# Patient Record
Sex: Female | Born: 1965 | Race: Black or African American | Hispanic: No | Marital: Single | Smoking: Former smoker
Health system: Southern US, Community
[De-identification: ages and names within clinical notes are randomized; demographics above are authoritative.]

## PROBLEM LIST (undated history)

## (undated) DIAGNOSIS — E785 Hyperlipidemia, unspecified: Secondary | ICD-10-CM

## (undated) DIAGNOSIS — E669 Obesity, unspecified: Secondary | ICD-10-CM

## (undated) DIAGNOSIS — I10 Essential (primary) hypertension: Secondary | ICD-10-CM

## (undated) HISTORY — DX: Essential (primary) hypertension: I10

## (undated) HISTORY — DX: Hyperlipidemia, unspecified: E78.5

---

## 1997-06-01 ENCOUNTER — Emergency Department (HOSPITAL_COMMUNITY): Admission: EM | Admit: 1997-06-01 | Discharge: 1997-06-01 | Payer: Self-pay | Admitting: Emergency Medicine

## 1997-06-05 ENCOUNTER — Encounter: Admission: RE | Admit: 1997-06-05 | Discharge: 1997-06-05 | Payer: Self-pay | Admitting: Family Medicine

## 1997-06-08 ENCOUNTER — Encounter: Admission: RE | Admit: 1997-06-08 | Discharge: 1997-09-06 | Payer: Self-pay | Admitting: *Deleted

## 1998-10-21 ENCOUNTER — Encounter: Admission: RE | Admit: 1998-10-21 | Discharge: 1998-10-21 | Payer: Self-pay | Admitting: Family Medicine

## 2001-12-11 ENCOUNTER — Inpatient Hospital Stay (HOSPITAL_COMMUNITY): Admission: AD | Admit: 2001-12-11 | Discharge: 2001-12-14 | Payer: Self-pay | Admitting: Obstetrics and Gynecology

## 2001-12-12 ENCOUNTER — Encounter (INDEPENDENT_AMBULATORY_CARE_PROVIDER_SITE_OTHER): Payer: Self-pay | Admitting: *Deleted

## 2001-12-18 ENCOUNTER — Inpatient Hospital Stay (HOSPITAL_COMMUNITY): Admission: AD | Admit: 2001-12-18 | Discharge: 2001-12-18 | Payer: Self-pay | Admitting: Obstetrics and Gynecology

## 2001-12-26 ENCOUNTER — Encounter: Admission: RE | Admit: 2001-12-26 | Discharge: 2001-12-26 | Payer: Self-pay | Admitting: Obstetrics and Gynecology

## 2002-01-07 ENCOUNTER — Encounter: Admission: RE | Admit: 2002-01-07 | Discharge: 2002-01-07 | Payer: Self-pay | Admitting: *Deleted

## 2002-02-04 ENCOUNTER — Other Ambulatory Visit: Admission: RE | Admit: 2002-02-04 | Discharge: 2002-02-04 | Payer: Self-pay | Admitting: Obstetrics and Gynecology

## 2002-02-04 ENCOUNTER — Encounter: Admission: RE | Admit: 2002-02-04 | Discharge: 2002-02-04 | Payer: Self-pay | Admitting: Obstetrics and Gynecology

## 2002-04-08 ENCOUNTER — Encounter: Admission: RE | Admit: 2002-04-08 | Discharge: 2002-04-08 | Payer: Self-pay | Admitting: Obstetrics and Gynecology

## 2003-06-07 ENCOUNTER — Emergency Department (HOSPITAL_COMMUNITY): Admission: EM | Admit: 2003-06-07 | Discharge: 2003-06-07 | Payer: Self-pay | Admitting: Emergency Medicine

## 2004-08-14 ENCOUNTER — Emergency Department (HOSPITAL_COMMUNITY): Admission: EM | Admit: 2004-08-14 | Discharge: 2004-08-15 | Payer: Self-pay | Admitting: Emergency Medicine

## 2004-09-12 ENCOUNTER — Emergency Department (HOSPITAL_COMMUNITY): Admission: EM | Admit: 2004-09-12 | Discharge: 2004-09-12 | Payer: Self-pay | Admitting: Emergency Medicine

## 2005-09-28 ENCOUNTER — Emergency Department (HOSPITAL_COMMUNITY): Admission: EM | Admit: 2005-09-28 | Discharge: 2005-09-28 | Payer: Self-pay | Admitting: Emergency Medicine

## 2008-07-08 ENCOUNTER — Emergency Department (HOSPITAL_COMMUNITY): Admission: EM | Admit: 2008-07-08 | Discharge: 2008-07-08 | Payer: Self-pay | Admitting: Family Medicine

## 2008-09-19 ENCOUNTER — Inpatient Hospital Stay (HOSPITAL_COMMUNITY): Admission: EM | Admit: 2008-09-19 | Discharge: 2008-09-23 | Payer: Self-pay | Admitting: Emergency Medicine

## 2008-09-20 ENCOUNTER — Encounter (INDEPENDENT_AMBULATORY_CARE_PROVIDER_SITE_OTHER): Payer: Self-pay | Admitting: *Deleted

## 2008-09-20 LAB — CONVERTED CEMR LAB: HDL: 27 mg/dL

## 2008-09-23 ENCOUNTER — Encounter (INDEPENDENT_AMBULATORY_CARE_PROVIDER_SITE_OTHER): Payer: Self-pay | Admitting: *Deleted

## 2008-11-02 ENCOUNTER — Ambulatory Visit: Payer: Self-pay | Admitting: Internal Medicine

## 2008-11-02 ENCOUNTER — Encounter (INDEPENDENT_AMBULATORY_CARE_PROVIDER_SITE_OTHER): Payer: Self-pay | Admitting: Internal Medicine

## 2008-11-02 DIAGNOSIS — R030 Elevated blood-pressure reading, without diagnosis of hypertension: Secondary | ICD-10-CM | POA: Insufficient documentation

## 2008-11-02 DIAGNOSIS — I219 Acute myocardial infarction, unspecified: Secondary | ICD-10-CM | POA: Insufficient documentation

## 2008-11-02 DIAGNOSIS — E119 Type 2 diabetes mellitus without complications: Secondary | ICD-10-CM

## 2008-11-02 DIAGNOSIS — D649 Anemia, unspecified: Secondary | ICD-10-CM

## 2008-11-03 ENCOUNTER — Encounter (INDEPENDENT_AMBULATORY_CARE_PROVIDER_SITE_OTHER): Payer: Self-pay | Admitting: Internal Medicine

## 2008-11-06 ENCOUNTER — Ambulatory Visit: Payer: Self-pay | Admitting: Internal Medicine

## 2008-11-06 LAB — CONVERTED CEMR LAB: Blood Glucose, Home Monitor: 2 mg/dL

## 2008-11-09 LAB — CONVERTED CEMR LAB
Iron: 10 ug/dL — ABNORMAL LOW (ref 42–145)
Saturation Ratios: 2 % — ABNORMAL LOW (ref 20–55)
TIBC: 405 ug/dL (ref 250–470)
TSH: 2.276 microintl units/mL (ref 0.350–4.5)
UIBC: 395 ug/dL
Vitamin B-12: 261 pg/mL (ref 211–911)

## 2008-11-10 ENCOUNTER — Encounter (INDEPENDENT_AMBULATORY_CARE_PROVIDER_SITE_OTHER): Payer: Self-pay | Admitting: *Deleted

## 2008-11-10 ENCOUNTER — Telehealth (INDEPENDENT_AMBULATORY_CARE_PROVIDER_SITE_OTHER): Payer: Self-pay | Admitting: Internal Medicine

## 2008-11-24 ENCOUNTER — Ambulatory Visit: Payer: Self-pay | Admitting: Infectious Disease

## 2008-11-27 ENCOUNTER — Ambulatory Visit: Payer: Self-pay | Admitting: Internal Medicine

## 2008-12-08 ENCOUNTER — Emergency Department (HOSPITAL_COMMUNITY): Admission: EM | Admit: 2008-12-08 | Discharge: 2008-12-08 | Payer: Self-pay | Admitting: Emergency Medicine

## 2008-12-21 ENCOUNTER — Telehealth: Payer: Self-pay | Admitting: Internal Medicine

## 2009-02-08 ENCOUNTER — Telehealth: Payer: Self-pay | Admitting: Internal Medicine

## 2009-07-09 ENCOUNTER — Emergency Department (HOSPITAL_COMMUNITY): Admission: EM | Admit: 2009-07-09 | Discharge: 2009-07-09 | Payer: Self-pay | Admitting: Emergency Medicine

## 2009-07-09 ENCOUNTER — Emergency Department (HOSPITAL_COMMUNITY): Admission: EM | Admit: 2009-07-09 | Discharge: 2009-07-09 | Payer: Self-pay | Admitting: Family Medicine

## 2009-08-18 ENCOUNTER — Emergency Department (HOSPITAL_COMMUNITY): Admission: EM | Admit: 2009-08-18 | Discharge: 2009-08-18 | Payer: Self-pay | Admitting: Family Medicine

## 2009-08-22 ENCOUNTER — Emergency Department (HOSPITAL_COMMUNITY): Admission: EM | Admit: 2009-08-22 | Discharge: 2009-08-22 | Payer: Self-pay | Admitting: Family Medicine

## 2010-02-15 NOTE — Progress Notes (Signed)
Summary: phone/gg  Phone Note Call from Patient   Caller: Patient Summary of Call: Pt call with c/o rt  ear pain for 1 week.  Today increase pain with radiation to jaw and neck.  Denies  any dranage.  It feels like she needs to pop ear. We are unable to see today.  Pt advised to be seen at [facility] today.  Pt voices understanding Initial call taken by: Merrie Roof RN,  February 08, 2009 10:49 AM  Follow-up for Phone Call        Agree with plan. Follow-up by: Blanch Media MD,  February 08, 2009 11:17 AM

## 2010-03-16 ENCOUNTER — Inpatient Hospital Stay (INDEPENDENT_AMBULATORY_CARE_PROVIDER_SITE_OTHER)
Admission: RE | Admit: 2010-03-16 | Discharge: 2010-03-16 | Disposition: A | Discharge status: Home / Self Care | Payer: BC Managed Care – PPO | Source: Ambulatory Visit | Attending: Emergency Medicine | Admitting: Emergency Medicine

## 2010-03-16 DIAGNOSIS — J019 Acute sinusitis, unspecified: Secondary | ICD-10-CM

## 2010-03-16 DIAGNOSIS — J40 Bronchitis, not specified as acute or chronic: Secondary | ICD-10-CM

## 2010-03-16 DIAGNOSIS — R071 Chest pain on breathing: Secondary | ICD-10-CM

## 2010-04-22 LAB — CARDIAC PANEL(CRET KIN+CKTOT+MB+TROPI)
CK, MB: 24.3 ng/mL — ABNORMAL HIGH (ref 0.3–4.0)
CK, MB: 44.6 ng/mL — ABNORMAL HIGH (ref 0.3–4.0)
Total CK: 508 U/L — ABNORMAL HIGH (ref 7–177)
Troponin I: 6.82 ng/mL (ref 0.00–0.06)

## 2010-04-22 LAB — CK TOTAL AND CKMB (NOT AT ARMC)
CK, MB: 7.2 ng/mL — ABNORMAL HIGH (ref 0.3–4.0)
Relative Index: 2.6 — ABNORMAL HIGH (ref 0.0–2.5)

## 2010-04-22 LAB — COMPREHENSIVE METABOLIC PANEL
ALT: 20 U/L (ref 0–35)
AST: 30 U/L (ref 0–37)
CO2: 24 mEq/L (ref 19–32)
Calcium: 8.1 mg/dL — ABNORMAL LOW (ref 8.4–10.5)
Creatinine, Ser: 0.75 mg/dL (ref 0.4–1.2)
GFR calc non Af Amer: >60 mL/min (ref >60)
Potassium: 3.5 mEq/L (ref 3.5–5.1)
Total Bilirubin: 0.4 mg/dL (ref 0.3–1.2)
Total Protein: 6.8 g/dL (ref 6.0–8.3)

## 2010-04-22 LAB — BASIC METABOLIC PANEL
CO2: 29 mEq/L (ref 19–32)
Calcium: 8.4 mg/dL (ref 8.4–10.5)
Calcium: 8.4 mg/dL (ref 8.4–10.5)
Chloride: 107 mEq/L (ref 96–112)
Creatinine, Ser: 0.77 mg/dL (ref 0.4–1.2)
Creatinine, Ser: 0.8 mg/dL (ref 0.4–1.2)
GFR calc non Af Amer: >60 mL/min (ref >60)
Glucose, Bld: 132 mg/dL — ABNORMAL HIGH (ref 70–99)

## 2010-04-22 LAB — GLUCOSE, CAPILLARY
Glucose-Capillary: 103 mg/dL — ABNORMAL HIGH (ref 70–99)
Glucose-Capillary: 91 mg/dL (ref 70–99)

## 2010-04-22 LAB — CBC
HCT: 36.7 % (ref 36.0–46.0)
Hemoglobin: 11.9 g/dL — ABNORMAL LOW (ref 12.0–15.0)
MCHC: 32.3 g/dL (ref 30.0–36.0)
MCV: 72.6 fL — ABNORMAL LOW (ref 78.0–100.0)
Platelets: 263 10*3/uL (ref 150–400)
RBC: 4.66 MIL/uL (ref 3.87–5.11)
RBC: 5.06 MIL/uL (ref 3.87–5.11)
RDW: 18.1 % — ABNORMAL HIGH (ref 11.5–15.5)
RDW: 18.4 % — ABNORMAL HIGH (ref 11.5–15.5)
WBC: 5.1 10*3/uL (ref 4.0–10.5)
WBC: 6.3 10*3/uL (ref 4.0–10.5)
WBC: 8 10*3/uL (ref 4.0–10.5)

## 2010-04-22 LAB — LIPID PANEL
Cholesterol: 108 mg/dL (ref 0–200)
HDL: 27 mg/dL — ABNORMAL LOW (ref >39)
LDL Cholesterol: 69 mg/dL (ref 0–99)
Total CHOL/HDL Ratio: 4 RATIO

## 2010-04-22 LAB — POCT CARDIAC MARKERS

## 2010-04-22 LAB — SAMPLE TO BLOOD BANK

## 2010-04-22 LAB — DIFFERENTIAL
Basophils Relative: 0 % (ref 0–1)
Eosinophils Absolute: 0.2 10*3/uL (ref 0.0–0.7)
Monocytes Relative: 7 % (ref 3–12)
Neutrophils Relative %: 44 % (ref 43–77)

## 2010-04-22 LAB — CK: Total CK: 381 U/L — ABNORMAL HIGH (ref 7–177)

## 2010-04-22 LAB — HEMOGLOBIN A1C: Mean Plasma Glucose: 146 mg/dL

## 2010-06-03 NOTE — Discharge Summary (Signed)
   NAME:  Rachel Avila, Rachel Avila                           ACCOUNT NO.:  [id number]   MEDICAL RECORD NO.:  [id number]                   PATIENT TYPE:  INP   LOCATION:  9114                                 FACILITY:  [facility]   PHYSICIAN:  Phil D. Okey Dupre, M.D.                  DATE OF BIRTH:  06/12/1965   DATE OF ADMISSION:  12/11/2001  DATE OF DISCHARGE:  12/14/2001                                 DISCHARGE SUMMARY   REASON FOR HOSPITALIZATION:  A 45 year old gravida 2 para 2-0-0-2 who  presented to [facility] emergency room with abdominal pain the a.m. of  admission, having had normal periods up until this month.  Was found to have  been pregnant at term and the cervix was 75/5 at a -3 station and the  patient was enormously obese.   HOSPITAL COURSE:  She was sent over to [facility] where an epidural  was started and a nonreassuring pattern of the fetal heart tone started  immediately.  As soon as possible she was taken to the operating room.  A  low transverse cesarean section was done for fetal distress.  The baby was a  female weighing 6 pounds 3 ounces with a 1 and 7 Apgar and a pathology  report that showed chorioamnionitis.  The patient did very well  postoperatively, had an afebrile postoperative course, and was discharged to  home on December 14, 2001, to return in four days for staple removal and  drain removal - the patient had a Jackson-Pratt drain.   SIGNIFICANT FINDINGS DURING THIS HOSPITALIZATION:  1. The patient not knowing she was pregnant, being admitted, undergoing     cesarean section for fetal distress.  2. Amnionitis and fundusitis.   DISPOSITION:  1. The patient was discharged in good health, satisfactory.  2. She was given instructions as to follow-up, diet, and activity,     especially heavy lifting and stairs to be avoided in the first two weeks     at home.  3. She was discharged on Motrin and Percocet for pain.   DISCHARGE DIAGNOSIS:  Status post cesarean  section for fetal distress.                                                Phil D. Okey Dupre, M.D.    PDR/MEDQ  D:  01/29/2002  T:  01/29/2002  Job:  [id number]

## 2010-06-03 NOTE — Op Note (Signed)
NAME:  Rachel Avila, BETTON                           ACCOUNT NO.:  [id number]   MEDICAL RECORD NO.:  [id number]                   PATIENT TYPE:  INP   LOCATION:  9171                                 FACILITY:  [facility]   PHYSICIAN:  Phil D. Okey Dupre, M.D.                  DATE OF BIRTH:  Sep 23, 1965   DATE OF PROCEDURE:  12/11/2001  DATE OF DISCHARGE:                                 OPERATIVE REPORT   HISTORY OF PRESENT ILLNESS:  The patient is a 45 year old gravida 2, para 1-  0-0-1 who presented at [facility] Emergency Room with abdominal pain this  a.m. having stated she had regular periods and a diagnosis she is pregnant,  5 cm dilated and had no idea of dates.  Was transferred over for direct  admission to [place].  On admitting the cervix was 70/5 cm at about a -3 station,  difficult because of the enormous obesity of the patient.  To monitor the  patient externally an internal lead we had put on and this ruptured  membranes which presented with heavy meconium.  The patient's laboratories  were drawn, IV started as quickly as possible.  The epidural was started.  The patient gave a significantly nonreassuring pattern with heart rate in  the 80s and low 100-105 and with late decelerations.  As soon as possible  was taken to the operating room where a Foley catheter was placed in the  urinary bladder and the abdomen was prepped and draped in the usual sterile  manner.  Her previous delivery had been by cesarean section.  The procedure  went as follows.   PROCEDURE:  Low transverse cesarean section.   PREOPERATIVE DIAGNOSES:  Fetal distress.   POSTOPERATIVE DIAGNOSES:  Fetal distress.   PROCEDURE AS FOLLOWS:  Under satisfactory epidural anesthesia with the  patient in a dorsal supine position the abdomen was prepped and draped in  the usual sterile manner with a Foley catheter in the urinary bladder  entered through a Pfannenstiel incision situated 4 cm above the symphysis  pubis and entered by  layers.  On entering the peritoneal cavity the  visceroperitoneum on the anterior surface of the uterus was opened  transversely by sharp dissection, the bladder pushed away from the leaf of  the lower uterine segment.  Was entered by sharp and blunt dissection.  Immediately entering the peritoneal cavity copious amounts of pea soup  meconium extruded from the incision.  The baby was in a direct posterior and  was delivered quite easily.  Before the baby was able to take a breath, the  upper respiratory tract was sucked out with the DeLee suction.  Cord doubly  clamped.  Meanwhile, the baby immediately taken to the pediatricians.  It  was a girl weighing 6 pounds 3 ounces with a 1/7 Apgar.  The placenta was  spontaneously moved and the uterus explored and  placenta sent for  pathological diagnosis.  The uterus was then closed with a continuous  running locked 0 Vicryl on an atraumatic needle.  Area was observed for  bleeding.  None was noted.  Peritoneal cavity was irrigated with normal  saline and the fascia closed from either incision meeting in the mid point.  A Blake suction drain was placed in the wound and exited through a separate  stab wound just above the right end of the incision and the skin edge  approximated with skin staples.  Dry, sterile dressing was applied.  The  patient had 600 cc blood loss.  Transferred to recovery room in satisfactory  condition.                                               Phil D. Okey Dupre, M.D.    PDR/MEDQ  D:  12/11/2001  T:  12/11/2001  Job:  [id number]

## 2010-06-06 ENCOUNTER — Encounter: Payer: Self-pay | Admitting: Internal Medicine

## 2010-06-06 DIAGNOSIS — E119 Type 2 diabetes mellitus without complications: Secondary | ICD-10-CM | POA: Insufficient documentation

## 2010-06-14 ENCOUNTER — Encounter: Payer: Self-pay | Admitting: Internal Medicine

## 2011-03-07 ENCOUNTER — Encounter (HOSPITAL_COMMUNITY): Payer: Self-pay | Admitting: Emergency Medicine

## 2011-03-07 ENCOUNTER — Emergency Department (HOSPITAL_COMMUNITY)
Admission: EM | Admit: 2011-03-07 | Discharge: 2011-03-07 | Disposition: A | Discharge status: Home / Self Care | Payer: BC Managed Care – PPO | Attending: Emergency Medicine | Admitting: Emergency Medicine

## 2011-03-07 DIAGNOSIS — H5789 Other specified disorders of eye and adnexa: Secondary | ICD-10-CM | POA: Insufficient documentation

## 2011-03-07 DIAGNOSIS — T1590XA Foreign body on external eye, part unspecified, unspecified eye, initial encounter: Secondary | ICD-10-CM | POA: Insufficient documentation

## 2011-03-07 DIAGNOSIS — S058X9A Other injuries of unspecified eye and orbit, initial encounter: Secondary | ICD-10-CM | POA: Insufficient documentation

## 2011-03-07 DIAGNOSIS — E785 Hyperlipidemia, unspecified: Secondary | ICD-10-CM | POA: Insufficient documentation

## 2011-03-07 DIAGNOSIS — S0502XA Injury of conjunctiva and corneal abrasion without foreign body, left eye, initial encounter: Secondary | ICD-10-CM

## 2011-03-07 DIAGNOSIS — I10 Essential (primary) hypertension: Secondary | ICD-10-CM | POA: Insufficient documentation

## 2011-03-07 DIAGNOSIS — E119 Type 2 diabetes mellitus without complications: Secondary | ICD-10-CM | POA: Insufficient documentation

## 2011-03-07 DIAGNOSIS — F17200 Nicotine dependence, unspecified, uncomplicated: Secondary | ICD-10-CM | POA: Insufficient documentation

## 2011-03-07 DIAGNOSIS — H5710 Ocular pain, unspecified eye: Secondary | ICD-10-CM | POA: Insufficient documentation

## 2011-03-07 MED ORDER — HYDROCODONE-ACETAMINOPHEN 5-500 MG PO TABS: 1.0000 | ORAL_TABLET | Freq: Four times a day (QID) | ORAL | Status: AC | PRN

## 2011-03-07 MED ORDER — ERYTHROMYCIN 5 MG/GM OP OINT: TOPICAL_OINTMENT | Freq: Four times a day (QID) | OPHTHALMIC | Status: AC

## 2011-03-07 MED ORDER — ERYTHROMYCIN 5 MG/GM OP OINT
TOPICAL_OINTMENT | Freq: Once | OPHTHALMIC | Status: AC
  Administered 2011-03-07: 19:00:00 via OPHTHALMIC
  Filled 2011-03-07: qty 1

## 2011-03-07 MED ORDER — TETRACAINE HCL 0.5 % OP SOLN
2.0000 [drp] | Freq: Once | OPHTHALMIC | Status: AC
  Administered 2011-03-07: 2 [drp] via OPHTHALMIC
  Filled 2011-03-07: qty 2

## 2011-03-07 MED ORDER — TETANUS-DIPHTH-ACELL PERTUSSIS 5-2.5-18.5 LF-MCG/0.5 IM SUSP
0.5000 mL | Freq: Once | INTRAMUSCULAR | Status: AC
  Administered 2011-03-07: 0.5 mL via INTRAMUSCULAR
  Filled 2011-03-07: qty 0.5

## 2011-03-07 MED ORDER — FLUORESCEIN SODIUM 1 MG OP STRP
1.0000 | ORAL_STRIP | Freq: Once | OPHTHALMIC | Status: AC
  Administered 2011-03-07: 1 via OPHTHALMIC
  Filled 2011-03-07: qty 1

## 2011-03-07 NOTE — Discharge Instructions (Signed)
Corneal Abrasion The cornea is the clear covering at the front and center of the eye. It is a thin tissue made up of layers. The top layer is the most sensitive layer. A corneal abrasion happens if this layer is scratched or an injury causes it to come off.  HOME CARE  You may be given drops or a medicated cream. Use the medicine as told by your doctor.   A pressure patch may be put over the eye. If this is done, follow your doctor's instructions for when to remove the patch. Do not drive or use machines while the eye patch is on. Judging distances is hard to do with a patch on.   See your doctor for a follow-up exam if you are told to do so.  GET HELP RIGHT AWAY IF:   The pain is getting worse or is very bad.   The eye is very sensitive to light.   Any liquid comes out of the injured eye after treatment.   Your vision suddenly gets worse.   You have a sudden loss of vision or blindness.  MAKE SURE YOU:   Understand these instructions.   Will watch your condition.   Will get help right away if you are not doing well or get worse.  Document Released: 06/21/2007 Document Revised: 09/14/2010 Document Reviewed: 06/21/2007 [employer] Patient Information 2012 [employer]. 

## 2011-03-07 NOTE — ED Provider Notes (Signed)
History     CSN: [id number]  Arrival date & time 03/07/11  1500   None     Chief Complaint  Patient presents with  . Eye Problem    (Consider location/radiation/quality/duration/timing/severity/associated sxs/prior treatment) HPI History provided by the patient.  46 year old female presenting with complaint of left eye injury and pain.  Pt  states that yesterday morning an unknown object or insect flew in her eye.  She reports immediate left eye pain and redness as well as mild blurry vision.  She immediately irrigated her eye with the "eye machine" twice which resulted in improvement. Patient left work and obtain artificial teardrops which applied after return of her discomfort. This also helped some. Patient states that she awoke this morning with worsening pain and what she believes is mild local swelling around her eye as well as decreased vision.  Patient reports mild watery discharge from the left eye without purulence. Patient's last tetanus is unknown. Patient has not been seen recently for these complaints. Patient does not wear contacts or use glasses. Patient reports symptoms as constant, moderate to severe, and worse when the left eye is open.    Past Medical History  Diagnosis Date  . Diabetes mellitus   . Hypertension   . Hyperlipidemia     Past Surgical History  Procedure Date  . Cesarean section     History reviewed. No pertinent family history.  History  Substance Use Topics  . Smoking status: Current Everyday Smoker -- 0.5 packs/day  . Smokeless tobacco: Not on file  . Alcohol Use: No    OB History    Grav Para Term Preterm Abortions TAB SAB Ect Mult Living                  Review of Systems  Constitutional: Negative for fever and chills.  HENT: Negative for congestion, sore throat and rhinorrhea.   Eyes: Positive for pain, discharge, redness and visual disturbance.  Respiratory: Negative for cough, shortness of breath and wheezing.     Cardiovascular: Negative for chest pain and palpitations.  Gastrointestinal: Negative for nausea, vomiting, abdominal pain, diarrhea and blood in stool.  Genitourinary: Negative for dysuria and hematuria.  Musculoskeletal: Negative for back pain and gait problem.  Skin: Negative for rash and wound.  Neurological: Negative for dizziness and headaches.  Psychiatric/Behavioral: Negative for confusion and agitation.  All other systems reviewed and are negative.    Allergies  Bee venom  Home Medications   Current Outpatient Rx  Name Route Sig Dispense Refill  . AMLODIPINE BESYLATE 2.5 MG PO TABS Oral Take 2.5 mg by mouth daily.      . ASPIRIN 81 MG PO TABS Oral Take 81 mg by mouth daily. Take 1 pill by mouth daily 30 minutes before you take niaspan     . FERROUS SULFATE 325 (65 FE) MG PO TBEC Oral Take 325 mg by mouth 2 (two) times daily with a meal.      . LISINOPRIL 10 MG PO TABS Oral Take 10 mg by mouth daily.      Marland Kitchen METFORMIN HCL 500 MG PO TABS Oral Take 500 mg by mouth every morning.     Marland Kitchen METOPROLOL SUCCINATE ER 50 MG PO TB24 Oral Take 50 mg by mouth daily.      Marland Kitchen NIACIN ER (ANTIHYPERLIPIDEMIC) 500 MG PO TBCR Oral Take 500 mg by mouth at bedtime.      Marland Kitchen NITROGLYCERIN 0.4 MG SL SUBL Sublingual Place 0.4 mg under  the tongue every 5 (five) minutes as needed. For chest pain    . SIMVASTATIN 20 MG PO TABS Oral Take 20 mg by mouth at bedtime.      Marland Kitchen GLUCOSE BLOOD VI STRP Other 1 each by Other route 2 (two) times daily. Use as instructed (Bayer Contour test strip), use to check blood sugar 2x daily     . LANCETS MISC Does not apply by Does not apply route. Use to test blood sugar 2x daily       BP 163/118  Pulse 62  Temp 98.5 F (36.9 C)  Resp 18  SpO2 97%  Physical Exam  Nursing note and vitals reviewed. Constitutional: She is oriented to person, place, and time. No distress.       Morbidly obese, appears uncomfortable but NAD, mother at bedside  HENT:  Head: Normocephalic and  atraumatic.  Right Ear: External ear normal.  Left Ear: External ear normal.  Nose: Nose normal.  Mouth/Throat: Oropharynx is clear and moist.  Eyes: Conjunctivae and EOM are normal. Pupils are equal, round, and reactive to light.       No periorbital edema, no pain elicited with Lt eye movement; no proptosis.  VA:  Rt: 20/20; Lt 20/20 -2.  After fluorescein staining:  Small speck (<69mm) of uptake at ~5 o'clock on the cornea  Neck: Normal range of motion. Neck supple.  Cardiovascular: Normal rate and regular rhythm.   Pulmonary/Chest: Effort normal and breath sounds normal. No respiratory distress.  Abdominal: Soft. Bowel sounds are normal. There is no tenderness.  Musculoskeletal: Normal range of motion. She exhibits no edema.  Neurological: She is alert and oriented to person, place, and time.  Skin: Skin is warm and dry. She is not diaphoretic.  Psychiatric: She has a normal mood and affect. Judgment normal.    ED Course  Procedures (including critical care time)  Labs Reviewed - No data to display No results found.   1. Corneal abrasion, left       MDM  46 year old female presenting one day status post possible injury to the left eye with an insect. Patient with intermittent pain improving with irrigation. The patient does not wear contacts.  Exam as above, minimally decreased vision on the left; fluorescein staining with small speck/corneal abrasion; improved pain after tetracaine drops also consistent with corneal abrasion. No signs of orbital or periorbital cellulitis or acute glaucoma.  Will treat with erythromycin ointment and have patient followup with ophthalmology for persistent symptoms.      Particia Lather, MD 03/08/11 [phone]

## 2011-03-07 NOTE — ED Notes (Signed)
Pt  States that she got something in her left eye at work yesterday  And it has got worse today

## 2011-03-08 NOTE — ED Provider Notes (Signed)
  I performed a history and physical examination of Rachel Avila and discussed her management with Dr. Lendell Caprice.  I agree with the history, physical, assessment, and plan of care, with the following exceptions: None  I was present for the following procedures: None Time Spent in Critical Care of the patient: None Time spent in discussions with the patient and family: 31  Rachel Avila  Well-appearing young female presents with left eye pain, on exam is found to have mild corneal abrasion.  Patient is not a contact lens wearer.  She was discharged in stable condition.  Gerhard Munch, MD 03/08/11 2201

## 2011-10-05 ENCOUNTER — Emergency Department (HOSPITAL_COMMUNITY): Payer: BC Managed Care – PPO

## 2011-10-05 ENCOUNTER — Encounter (HOSPITAL_COMMUNITY): Payer: Self-pay | Admitting: *Deleted

## 2011-10-05 ENCOUNTER — Emergency Department (HOSPITAL_COMMUNITY)
Admission: EM | Admit: 2011-10-05 | Discharge: 2011-10-06 | Disposition: A | Discharge status: Home / Self Care | Payer: BC Managed Care – PPO | Attending: Emergency Medicine | Admitting: Emergency Medicine

## 2011-10-05 DIAGNOSIS — I10 Essential (primary) hypertension: Secondary | ICD-10-CM | POA: Insufficient documentation

## 2011-10-05 DIAGNOSIS — J069 Acute upper respiratory infection, unspecified: Secondary | ICD-10-CM | POA: Insufficient documentation

## 2011-10-05 DIAGNOSIS — E785 Hyperlipidemia, unspecified: Secondary | ICD-10-CM | POA: Insufficient documentation

## 2011-10-05 DIAGNOSIS — E119 Type 2 diabetes mellitus without complications: Secondary | ICD-10-CM | POA: Insufficient documentation

## 2011-10-05 DIAGNOSIS — B9789 Other viral agents as the cause of diseases classified elsewhere: Secondary | ICD-10-CM | POA: Insufficient documentation

## 2011-10-05 DIAGNOSIS — Z87891 Personal history of nicotine dependence: Secondary | ICD-10-CM | POA: Insufficient documentation

## 2011-10-05 DIAGNOSIS — Z91038 Other insect allergy status: Secondary | ICD-10-CM | POA: Insufficient documentation

## 2011-10-05 MED ORDER — IPRATROPIUM BROMIDE 0.02 % IN SOLN
0.5000 mg | Freq: Once | RESPIRATORY_TRACT | Status: AC
  Administered 2011-10-05: 0.5 mg via RESPIRATORY_TRACT
  Filled 2011-10-05: qty 2.5

## 2011-10-05 MED ORDER — ALBUTEROL SULFATE HFA 108 (90 BASE) MCG/ACT IN AERS
2.0000 | INHALATION_SPRAY | RESPIRATORY_TRACT | Status: DC | PRN
  Administered 2011-10-05: 2 via RESPIRATORY_TRACT
  Filled 2011-10-05: qty 6.7

## 2011-10-05 MED ORDER — ALBUTEROL SULFATE (5 MG/ML) 0.5% IN NEBU
5.0000 mg | INHALATION_SOLUTION | Freq: Once | RESPIRATORY_TRACT | Status: AC
  Administered 2011-10-05: 5 mg via RESPIRATORY_TRACT
  Filled 2011-10-05: qty 1

## 2011-10-05 MED ORDER — HYDROCOD POLST-CHLORPHEN POLST 10-8 MG/5ML PO LQCR: 5.0000 mL | Freq: Two times a day (BID) | ORAL | Status: DC | PRN

## 2011-10-05 NOTE — ED Notes (Addendum)
Patient with flu like symptoms since Tuesday, chills low grade fever, body aches, and sore throat.  Patient is also experiencing nasal congestion/drainage.  Patient with wheezing and coughing

## 2011-10-05 NOTE — ED Provider Notes (Signed)
History     CSN: [id number]  Arrival date & time 10/05/11  2002   First MD Initiated Contact with Patient 10/05/11 2055      Chief Complaint  Patient presents with  . Influenza    (Consider location/radiation/quality/duration/timing/severity/associated sxs/prior treatment) HPI History provided by pt.   Pt believes she has the flu.  Developed a sore throat 2 days ago, followed closely by a non-productive cough,  diffuse chest pain w/ cough and chest congestion, diffuse head pressure, rhinorrhea and N/V.  Has also had generalized weakness and body aches.  Denies fever, abdominal pain, diarrhea and urinary sx.  Has not taken anything for sx.  Known sick contacts.    Past Medical History  Diagnosis Date  . Diabetes mellitus   . Hypertension   . Hyperlipidemia     Past Surgical History  Procedure Date  . Cesarean section     No family history on file.  History  Substance Use Topics  . Smoking status: Former Smoker -- 0.5 packs/day  . Smokeless tobacco: Not on file  . Alcohol Use: No    OB History    Grav Para Term Preterm Abortions TAB SAB Ect Mult Living                  Review of Systems  All other systems reviewed and are negative.    Allergies  Bee venom  Home Medications   Current Outpatient Rx  Name Route Sig Dispense Refill  . AMLODIPINE BESYLATE 2.5 MG PO TABS Oral Take 2.5 mg by mouth daily.      . ASPIRIN 81 MG PO TABS Oral Take 81 mg by mouth daily. Take 1 pill by mouth daily 30 minutes before you take niaspan     . FERROUS SULFATE 325 (65 FE) MG PO TBEC Oral Take 325 mg by mouth 2 (two) times daily with a meal.      . LISINOPRIL 10 MG PO TABS Oral Take 10 mg by mouth daily.      Marland Kitchen METFORMIN HCL 500 MG PO TABS Oral Take 500 mg by mouth every morning.     Marland Kitchen METOPROLOL SUCCINATE ER 50 MG PO TB24 Oral Take 50 mg by mouth daily.      Marland Kitchen NIACIN ER (ANTIHYPERLIPIDEMIC) 500 MG PO TBCR Oral Take 500 mg by mouth at bedtime.      Marland Kitchen NITROGLYCERIN 0.4 MG SL  SUBL Sublingual Place 0.4 mg under the tongue every 5 (five) minutes as needed. For chest pain    . SIMVASTATIN 20 MG PO TABS Oral Take 20 mg by mouth at bedtime.        BP 166/108  Pulse 92  Temp 98.3 F (36.8 C) (Oral)  Resp 22  SpO2 95%  LMP 10/05/2011  Physical Exam  Nursing note and vitals reviewed. Constitutional: She is oriented to person, place, and time. She appears well-developed and well-nourished. No distress.       Morbidly obese  HENT:  Head: Normocephalic and atraumatic. No trismus in the jaw.  Mouth/Throat: Uvula is midline, oropharynx is clear and moist and mucous membranes are normal. No posterior oropharyngeal edema or posterior oropharyngeal erythema.       Nasal discharge  Eyes:       Normal appearance  Neck: Normal range of motion. Neck supple.  Cardiovascular: Normal rate and regular rhythm.   Pulmonary/Chest: Effort normal and breath sounds normal.       Diffuse, mild tenderness  Abdominal: Soft. Bowel  sounds are normal. She exhibits no distension. There is no tenderness.  Musculoskeletal: Normal range of motion.  Lymphadenopathy:    She has no cervical adenopathy.  Neurological: She is alert and oriented to person, place, and time.  Skin: Skin is warm and dry. No rash noted.  Psychiatric: She has a normal mood and affect. Her behavior is normal.    ED Course  Procedures (including critical care time)  Labs Reviewed - No data to display Dg Chest 2 View  10/05/2011  *RADIOLOGY REPORT*  Clinical Data: Chest congestion with cough.  Wheezing and shortness of breath.  CHEST - 2 VIEW  Comparison: One view chest of 09/19/2008.  Findings: The heart size is normal.  The lungs are clear. Degenerative changes are present in the thoracic spine.  The lateral view is obscured by arm position.  IMPRESSION: No acute cardiopulmonary disease.   Original Report Authenticated By: Jamesetta Orleans. MATTERN, M.D.      1. Viral URI with cough       MDM  46yo F w/ h/o  HTN and diabetes presents w/ URI sx x 2d.  On exam, afebrile, no respiratory distress, diffuse inspiratory and expiratory wheezing, nml HEENT.  CXR obtained in triage and neg for pneumonia. Pt received two breathing treatments w/ relief.  Breath sounds coarsened on re-examination and patient ambulates w/out becoming short of breath.  D/c'd home w/ albuterol inhaler, prescription for tussionex and recommendation to take tylenol or low dose ibuprofen as well as short course of sudafed for symptoms.  Return precautions including worsening SOB as well as CP discussed.         Arie Sabina [place], [place] 10/05/11 2337

## 2011-10-05 NOTE — ED Notes (Signed)
Patient has completed 2nd neb tx. States that she feels a little better but breathing is still not great.

## 2011-10-05 NOTE — ED Notes (Signed)
Patient was given discharge instructions and released from the ED in stable condition.  

## 2011-10-06 NOTE — ED Provider Notes (Signed)
Medical screening examination/treatment/procedure(s) were performed by non-physician practitioner and as supervising physician I was immediately available for consultation/collaboration.  Cheri Guppy, MD 10/06/11 [phone]

## 2012-07-25 ENCOUNTER — Other Ambulatory Visit: Payer: Self-pay

## 2013-01-03 ENCOUNTER — Emergency Department (HOSPITAL_COMMUNITY)
Admission: EM | Admit: 2013-01-03 | Discharge: 2013-01-04 | Disposition: A | Discharge status: Home / Self Care | Payer: BC Managed Care – PPO | Attending: Emergency Medicine | Admitting: Emergency Medicine

## 2013-01-03 ENCOUNTER — Emergency Department (HOSPITAL_COMMUNITY): Payer: BC Managed Care – PPO

## 2013-01-03 ENCOUNTER — Encounter (HOSPITAL_COMMUNITY): Payer: Self-pay | Admitting: Emergency Medicine

## 2013-01-03 DIAGNOSIS — R6883 Chills (without fever): Secondary | ICD-10-CM | POA: Insufficient documentation

## 2013-01-03 DIAGNOSIS — J189 Pneumonia, unspecified organism: Secondary | ICD-10-CM

## 2013-01-03 DIAGNOSIS — E785 Hyperlipidemia, unspecified: Secondary | ICD-10-CM | POA: Insufficient documentation

## 2013-01-03 DIAGNOSIS — I10 Essential (primary) hypertension: Secondary | ICD-10-CM | POA: Insufficient documentation

## 2013-01-03 DIAGNOSIS — Z7982 Long term (current) use of aspirin: Secondary | ICD-10-CM | POA: Insufficient documentation

## 2013-01-03 DIAGNOSIS — R197 Diarrhea, unspecified: Secondary | ICD-10-CM | POA: Insufficient documentation

## 2013-01-03 DIAGNOSIS — J3489 Other specified disorders of nose and nasal sinuses: Secondary | ICD-10-CM | POA: Insufficient documentation

## 2013-01-03 DIAGNOSIS — Z87891 Personal history of nicotine dependence: Secondary | ICD-10-CM | POA: Insufficient documentation

## 2013-01-03 DIAGNOSIS — R112 Nausea with vomiting, unspecified: Secondary | ICD-10-CM | POA: Insufficient documentation

## 2013-01-03 DIAGNOSIS — R5381 Other malaise: Secondary | ICD-10-CM | POA: Insufficient documentation

## 2013-01-03 DIAGNOSIS — Z79899 Other long term (current) drug therapy: Secondary | ICD-10-CM | POA: Insufficient documentation

## 2013-01-03 DIAGNOSIS — E119 Type 2 diabetes mellitus without complications: Secondary | ICD-10-CM | POA: Insufficient documentation

## 2013-01-03 DIAGNOSIS — IMO0001 Reserved for inherently not codable concepts without codable children: Secondary | ICD-10-CM | POA: Insufficient documentation

## 2013-01-03 LAB — CBC WITH DIFFERENTIAL/PLATELET
Basophils Absolute: 0 10*3/uL (ref 0.0–0.1)
Basophils Relative: 0 % (ref 0–1)
Eosinophils Absolute: 0.1 10*3/uL (ref 0.0–0.7)
Eosinophils Relative: 2 % (ref 0–5)
HCT: 36.2 % (ref 36.0–46.0)
MCH: 23.3 pg — ABNORMAL LOW (ref 26.0–34.0)
MCHC: 33.1 g/dL (ref 30.0–36.0)
Monocytes Absolute: 0.6 10*3/uL (ref 0.1–1.0)
Neutro Abs: 1.9 10*3/uL (ref 1.7–7.7)
RDW: 17.7 % — ABNORMAL HIGH (ref 11.5–15.5)

## 2013-01-03 LAB — URINALYSIS, ROUTINE W REFLEX MICROSCOPIC
Ketones, ur: 15 mg/dL — AB
Nitrite: NEGATIVE
Protein, ur: 30 mg/dL — AB
Urobilinogen, UA: 1 mg/dL (ref 0.0–1.0)

## 2013-01-03 LAB — COMPREHENSIVE METABOLIC PANEL
AST: 35 U/L (ref 0–37)
Albumin: 3.6 g/dL (ref 3.5–5.2)
BUN: 8 mg/dL (ref 6–23)
Calcium: 8.5 mg/dL (ref 8.4–10.5)
Chloride: 104 mEq/L (ref 96–112)
Creatinine, Ser: 0.87 mg/dL (ref 0.50–1.10)
Total Protein: 7.3 g/dL (ref 6.0–8.3)

## 2013-01-03 LAB — URINE MICROSCOPIC-ADD ON

## 2013-01-03 MED ORDER — SODIUM CHLORIDE 0.9 % IV BOLUS (SEPSIS)
500.0000 mL | Freq: Once | INTRAVENOUS | Status: AC
  Administered 2013-01-03: 500 mL via INTRAVENOUS

## 2013-01-03 MED ORDER — SODIUM CHLORIDE 0.9 % IV SOLN: INTRAVENOUS | Status: DC

## 2013-01-03 MED ORDER — DEXTROSE 5 % IV SOLN
1.0000 g | Freq: Once | INTRAVENOUS | Status: AC
  Administered 2013-01-03: 1 g via INTRAVENOUS
  Filled 2013-01-03: qty 10

## 2013-01-03 MED ORDER — AZITHROMYCIN 250 MG PO TABS: 250.0000 mg | ORAL_TABLET | Freq: Every day | ORAL | Status: DC

## 2013-01-03 NOTE — ED Notes (Signed)
Patient presents with c/o chills, cough, coughs so hard she sometimes will have a small amount of "emesis".  States everything is clear.  Daughter was seen and given a prescription for Tamiflu

## 2013-01-03 NOTE — ED Notes (Signed)
Dr Zackowski in to see patient. 

## 2013-01-03 NOTE — ED Notes (Signed)
Pt reports that earlier in the week she started to have a cough, fever and vomiting with diarrhea. States that she has not gotten any better over the past couple of days. Reports that she has not been able to go to work for the past 2 days.

## 2013-01-03 NOTE — ED Notes (Signed)
Attempt times 2 for IV without success. 

## 2013-01-04 NOTE — ED Provider Notes (Signed)
CSN: [id number]     Arrival date & time 01/03/13  1728 History   First MD Initiated Contact with Patient 01/03/13 1932     Chief Complaint  Patient presents with  . Cough  . Fatigue  . Emesis   (Consider location/radiation/quality/duration/timing/severity/associated sxs/prior Treatment) Patient is a 47 y.o. female presenting with cough and vomiting. The history is provided by the patient.  Cough Associated symptoms: chills and myalgias   Associated symptoms: no fever, no headaches, no rash, no shortness of breath and no sore throat   Emesis Associated symptoms: chills, diarrhea and myalgias   Associated symptoms: no abdominal pain, no headaches and no sore throat   47 year old female presents with the complaint of persistent cough achiness bodyaches cough occasionally productive no fever chills earlier in the week had some vomiting and diarrhea that has resolved. Several family members with similar illness. Patient denies shortness of breath denies fever. Patient has been unable to work for the past 2 days.  Past Medical History  Diagnosis Date  . Diabetes mellitus   . Hypertension   . Hyperlipidemia    Past Surgical History  Procedure Laterality Date  . Cesarean section     History reviewed. No pertinent family history. History  Substance Use Topics  . Smoking status: Former Smoker -- 0.50 packs/day  . Smokeless tobacco: Not on file  . Alcohol Use: No   OB History   Grav Para Term Preterm Abortions TAB SAB Ect Mult Living                 Review of Systems  Constitutional: Positive for chills. Negative for fever.  HENT: Positive for congestion. Negative for sore throat.   Eyes: Negative for redness.  Respiratory: Positive for cough. Negative for shortness of breath.   Gastrointestinal: Positive for nausea, vomiting and diarrhea. Negative for abdominal pain.  Genitourinary: Negative for dysuria.  Musculoskeletal: Positive for myalgias. Negative for back pain.  Skin:  Negative for rash.  Neurological: Negative for headaches.  Hematological: Does not bruise/bleed easily.  Psychiatric/Behavioral: Negative for confusion.    Allergies  Bee venom  Home Medications   Current Outpatient Rx  Name  Route  Sig  Dispense  Refill  . amLODipine (NORVASC) 2.5 MG tablet   Oral   Take 2.5 mg by mouth daily.           Marland Kitchen aspirin 81 MG tablet   Oral   Take 81 mg by mouth daily. Take 1 pill by mouth daily 30 minutes before you take niaspan          . ferrous sulfate 325 (65 FE) MG EC tablet   Oral   Take 325 mg by mouth 2 (two) times daily with a meal.           . lisinopril (PRINIVIL,ZESTRIL) 10 MG tablet   Oral   Take 10 mg by mouth daily.           . metFORMIN (GLUCOPHAGE) 500 MG tablet   Oral   Take 500 mg by mouth every morning.          . metoprolol (TOPROL-XL) 50 MG 24 hr tablet   Oral   Take 50 mg by mouth daily.           . nitroGLYCERIN (NITROSTAT) 0.4 MG SL tablet   Sublingual   Place 0.4 mg under the tongue every 5 (five) minutes as needed. For chest pain         .  azithromycin (ZITHROMAX) 250 MG tablet   Oral   Take 1 tablet (250 mg total) by mouth daily. Take first 2 tablets together, then 1 every day until finished.   6 tablet   0    BP 188/104  Pulse 73  Temp(Src) 98.5 F (36.9 C) (Oral)  Resp 16  SpO2 95%  LMP 12/16/2012 Physical Exam  Nursing note and vitals reviewed. Constitutional: She is oriented to person, place, and time. She appears well-developed and well-nourished. No distress.  HENT:  Head: Normocephalic and atraumatic.  Mouth/Throat: Oropharynx is clear and moist.  Eyes: Conjunctivae and EOM are normal. Pupils are equal, round, and reactive to light.  Neck: Normal range of motion.  Cardiovascular: Normal rate and regular rhythm.   No murmur heard. Pulmonary/Chest: Effort normal and breath sounds normal. No respiratory distress.  Abdominal: Soft. Bowel sounds are normal. There is no tenderness.   Musculoskeletal: Normal range of motion.  Neurological: She is alert and oriented to person, place, and time. No cranial nerve deficit. She exhibits normal muscle tone. Coordination normal.  Skin: Skin is warm. No rash noted.    ED Course  Procedures (including critical care time) Labs Review Labs Reviewed  CBC WITH DIFFERENTIAL - Abnormal; Notable for the following:    RBC 5.14 (*)    MCV 70.4 (*)    MCH 23.3 (*)    RDW 17.7 (*)    Neutrophils Relative % 41 (*)    All other components within normal limits  COMPREHENSIVE METABOLIC PANEL - Abnormal; Notable for the following:    Potassium 3.3 (*)    Total Bilirubin 0.2 (*)    GFR calc non Af Amer 79 (*)    All other components within normal limits  URINALYSIS, ROUTINE W REFLEX MICROSCOPIC - Abnormal; Notable for the following:    APPearance CLOUDY (*)    Bilirubin Urine SMALL (*)    Ketones, ur 15 (*)    Protein, ur 30 (*)    Leukocytes, UA SMALL (*)    All other components within normal limits  URINE MICROSCOPIC-ADD ON - Abnormal; Notable for the following:    Squamous Epithelial / LPF FEW (*)    Bacteria, UA FEW (*)    All other components within normal limits   Results for orders placed during the hospital encounter of 01/03/13  CBC WITH DIFFERENTIAL      Result Value Range   WBC 4.7  4.0 - 10.5 K/uL   RBC 5.14 (*) 3.87 - 5.11 MIL/uL   Hemoglobin 12.0  12.0 - 15.0 g/dL   HCT 16.1  09.6 - 04.5 %   MCV 70.4 (*) 78.0 - 100.0 fL   MCH 23.3 (*) 26.0 - 34.0 pg   MCHC 33.1  30.0 - 36.0 g/dL   RDW 40.9 (*) 81.1 - 91.4 %   Platelets 201  150 - 400 K/uL   Neutrophils Relative % 41 (*) 43 - 77 %   Neutro Abs 1.9  1.7 - 7.7 K/uL   Lymphocytes Relative 46  12 - 46 %   Lymphs Abs 2.2  0.7 - 4.0 K/uL   Monocytes Relative 12  3 - 12 %   Monocytes Absolute 0.6  0.1 - 1.0 K/uL   Eosinophils Relative 2  0 - 5 %   Eosinophils Absolute 0.1  0.0 - 0.7 K/uL   Basophils Relative 0  0 - 1 %   Basophils Absolute 0.0  0.0 - 0.1 K/uL   COMPREHENSIVE METABOLIC PANEL  Result Value Range   Sodium 140  135 - 145 mEq/L   Potassium 3.3 (*) 3.5 - 5.1 mEq/L   Chloride 104  96 - 112 mEq/L   CO2 29  19 - 32 mEq/L   Glucose, Bld 91  70 - 99 mg/dL   BUN 8  6 - 23 mg/dL   Creatinine, Ser 1.61  0.50 - 1.10 mg/dL   Calcium 8.5  8.4 - 09.6 mg/dL   Total Protein 7.3  6.0 - 8.3 g/dL   Albumin 3.6  3.5 - 5.2 g/dL   AST 35  0 - 37 U/L   ALT 24  0 - 35 U/L   Alkaline Phosphatase 66  39 - 117 U/L   Total Bilirubin 0.2 (*) 0.3 - 1.2 mg/dL   GFR calc non Af Amer 79 (*) >90 mL/min   GFR calc Af Amer >90  >90 mL/min  URINALYSIS, ROUTINE W REFLEX MICROSCOPIC      Result Value Range   Color, Urine YELLOW  YELLOW   APPearance CLOUDY (*) CLEAR   Specific Gravity, Urine 1.028  1.005 - 1.030   pH 6.0  5.0 - 8.0   Glucose, UA NEGATIVE  NEGATIVE mg/dL   Hgb urine dipstick NEGATIVE  NEGATIVE   Bilirubin Urine SMALL (*) NEGATIVE   Ketones, ur 15 (*) NEGATIVE mg/dL   Protein, ur 30 (*) NEGATIVE mg/dL   Urobilinogen, UA 1.0  0.0 - 1.0 mg/dL   Nitrite NEGATIVE  NEGATIVE   Leukocytes, UA SMALL (*) NEGATIVE  URINE MICROSCOPIC-ADD ON      Result Value Range   Squamous Epithelial / LPF FEW (*) RARE   WBC, UA 0-2  <3 WBC/hpf   RBC / HPF 0-2  <3 RBC/hpf   Bacteria, UA FEW (*) RARE   Urine-Other MUCOUS PRESENT      Imaging Review Dg Chest 2 View  01/03/2013   CLINICAL DATA:  Cough, congestion  EXAM: CHEST  2 VIEW  COMPARISON:  10/05/2011  FINDINGS: Mild patchy right upper lobe opacity, suspicious for pneumonia. No pleural effusion or pneumothorax.  Heart is normal in size.  Degenerative changes of the visualized thoracolumbar spine.  IMPRESSION: Mild patchy right upper lobe opacity, suspicious for pneumonia.   Electronically Signed   By: Charline Bills M.D.   On: 01/03/2013 20:58    EKG Interpretation   None       MDM   1. CAP (community acquired pneumonia)    Patient's workup confirms early pneumonia. Sounds as if started with  a viral illness that several family members have. Patient does have a history of diabetes blood sugars are well controlled here. No significant leukocytosis. Patient treated with 1 g of Rocephin here in the emergency department for community-acquired pneumonia we'll continue Zithromax at home. Patient is followed by outpatient clinics and has followup. Patient is nontoxic not hypoxic.    Shelda Jakes, MD 01/04/13 [phone]

## 2014-04-03 ENCOUNTER — Encounter (HOSPITAL_COMMUNITY): Payer: Self-pay | Admitting: *Deleted

## 2014-04-03 ENCOUNTER — Emergency Department (HOSPITAL_COMMUNITY)
Admission: EM | Admit: 2014-04-03 | Discharge: 2014-04-03 | Disposition: A | Discharge status: Home / Self Care | Payer: BLUE CROSS/BLUE SHIELD | Attending: Emergency Medicine | Admitting: Emergency Medicine

## 2014-04-03 DIAGNOSIS — E119 Type 2 diabetes mellitus without complications: Secondary | ICD-10-CM | POA: Insufficient documentation

## 2014-04-03 DIAGNOSIS — I10 Essential (primary) hypertension: Secondary | ICD-10-CM | POA: Insufficient documentation

## 2014-04-03 DIAGNOSIS — Y998 Other external cause status: Secondary | ICD-10-CM | POA: Diagnosis not present

## 2014-04-03 DIAGNOSIS — X58XXXA Exposure to other specified factors, initial encounter: Secondary | ICD-10-CM | POA: Insufficient documentation

## 2014-04-03 DIAGNOSIS — Y9289 Other specified places as the place of occurrence of the external cause: Secondary | ICD-10-CM | POA: Diagnosis not present

## 2014-04-03 DIAGNOSIS — E669 Obesity, unspecified: Secondary | ICD-10-CM | POA: Insufficient documentation

## 2014-04-03 DIAGNOSIS — Z792 Long term (current) use of antibiotics: Secondary | ICD-10-CM | POA: Diagnosis not present

## 2014-04-03 DIAGNOSIS — Z79899 Other long term (current) drug therapy: Secondary | ICD-10-CM | POA: Diagnosis not present

## 2014-04-03 DIAGNOSIS — Y9389 Activity, other specified: Secondary | ICD-10-CM | POA: Insufficient documentation

## 2014-04-03 DIAGNOSIS — Z87891 Personal history of nicotine dependence: Secondary | ICD-10-CM | POA: Insufficient documentation

## 2014-04-03 DIAGNOSIS — T162XXA Foreign body in left ear, initial encounter: Secondary | ICD-10-CM | POA: Diagnosis present

## 2014-04-03 HISTORY — DX: Obesity, unspecified: E66.9

## 2014-04-03 NOTE — ED Notes (Signed)
Pt reports possible insect in left ear, feels like something is moving.

## 2014-04-03 NOTE — ED Provider Notes (Signed)
CSN: [id number]     Arrival date & time 04/03/14  0801 History   First MD Initiated Contact with Patient 04/03/14 [phone]     Chief Complaint  Patient presents with  . Foreign Body in Ear     (Consider location/radiation/quality/duration/timing/severity/associated sxs/prior Treatment) Patient is a 49 y.o. female presenting with foreign body in ear. The history is provided by the patient. No language interpreter was used.  Foreign Body in Ear This is a new problem. The current episode started today. The problem occurs constantly. Nothing aggravates the symptoms. She has tried nothing for the symptoms.    Past Medical History  Diagnosis Date  . Diabetes mellitus   . Hypertension   . Hyperlipidemia   . Obesity    Past Surgical History  Procedure Laterality Date  . Cesarean section     History reviewed. No pertinent family history. History  Substance Use Topics  . Smoking status: Former Smoker -- 0.50 packs/day  . Smokeless tobacco: Not on file  . Alcohol Use: No   OB History    No data available     Review of Systems  All other systems reviewed and are negative.     Allergies  Bee venom  Home Medications   Prior to Admission medications   Medication Sig Start Date End Date Taking? Authorizing Provider  amLODipine (NORVASC) 2.5 MG tablet Take 2.5 mg by mouth daily.      Historical Provider, MD  aspirin 81 MG tablet Take 81 mg by mouth daily. Take 1 pill by mouth daily 30 minutes before you take niaspan     Historical Provider, MD  azithromycin (ZITHROMAX) 250 MG tablet Take 1 tablet (250 mg total) by mouth daily. Take first 2 tablets together, then 1 every day until finished. 01/03/13   Vanetta MuldersScott Zackowski, MD  ferrous sulfate 325 (65 FE) MG EC tablet Take 325 mg by mouth 2 (two) times daily with a meal.      Historical Provider, MD  lisinopril (PRINIVIL,ZESTRIL) 10 MG tablet Take 10 mg by mouth daily.      Historical Provider, MD  metFORMIN (GLUCOPHAGE) 500 MG tablet Take  500 mg by mouth every morning.     Historical Provider, MD  metoprolol (TOPROL-XL) 50 MG 24 hr tablet Take 50 mg by mouth daily.      Historical Provider, MD  nitroGLYCERIN (NITROSTAT) 0.4 MG SL tablet Place 0.4 mg under the tongue every 5 (five) minutes as needed. For chest pain    Historical Provider, MD   BP 186/98 mmHg  Pulse 70  Temp(Src) 98.3 F (36.8 C) (Oral)  Resp 18  SpO2 98%  LMP 03/25/2014 Physical Exam  Constitutional: She appears well-developed and well-nourished.  HENT:  Right Ear: External ear normal.  fb noted to the left ear  Eyes: Conjunctivae and EOM are normal. Pupils are equal, round, and reactive to light.  Cardiovascular: Normal rate and regular rhythm.   Pulmonary/Chest: Effort normal and breath sounds normal.  Musculoskeletal: Normal range of motion.  Nursing note and vitals reviewed.   ED Course  Procedures (including critical care time) Labs Review Labs Reviewed - No data to display  Imaging Review No results found.   EKG Interpretation None      MDM   Final diagnoses:  Foreign body in ear, left, initial encounter    fb removed    Teressa LowerVrinda Leilyn Frayre, NP 04/03/14 0957  Blane OharaJoshua Zavitz, MD 04/03/14 1615

## 2014-04-03 NOTE — Discharge Instructions (Signed)
Ear Foreign Body °An ear foreign body is an object that is stuck in the ear. It is common for young children to put objects into the ear canal. These may include pebbles, beads, beans, and any other small objects which will fit. In adults, objects such as cotton swabs may become lodged in the ear canal. In all ages, the most common foreign bodies are insects that enter the ear canal.  °SYMPTOMS  °Foreign bodies may cause pain, buzzing or roaring sounds, hearing loss, and ear drainage.  °HOME CARE INSTRUCTIONS  °· Keep all follow-up appointments with your caregiver as told. °· Keep small objects out of reach of young children. Tell them not to put anything in their ears. °SEEK IMMEDIATE MEDICAL CARE IF:  °· You have bleeding from the ear. °· You have increased pain or swelling of the ear. °· You have reduced hearing. °· You have discharge coming from the ear. °· You have a fever. °· You have a headache. °MAKE SURE YOU:  °· Understand these instructions. °· Will watch your condition. °· Will get help right away if you are not doing well or get worse. °Document Released: 12/31/1999 Document Revised: 03/27/2011 Document Reviewed: 08/21/2007 °[employer]® Patient Information ©2015 [employer]. This information is not intended to replace advice given to you by your health care provider. Make sure you discuss any questions you have with your health care provider. ° °

## 2017-12-08 ENCOUNTER — Encounter (HOSPITAL_COMMUNITY): Payer: Self-pay

## 2017-12-08 ENCOUNTER — Emergency Department (HOSPITAL_COMMUNITY)
Admission: EM | Admit: 2017-12-08 | Discharge: 2017-12-08 | Disposition: A | Discharge status: Home / Self Care | Payer: BLUE CROSS/BLUE SHIELD | Attending: Emergency Medicine | Admitting: Emergency Medicine

## 2017-12-08 DIAGNOSIS — R040 Epistaxis: Secondary | ICD-10-CM | POA: Insufficient documentation

## 2017-12-08 DIAGNOSIS — E119 Type 2 diabetes mellitus without complications: Secondary | ICD-10-CM | POA: Insufficient documentation

## 2017-12-08 DIAGNOSIS — Z7984 Long term (current) use of oral hypoglycemic drugs: Secondary | ICD-10-CM | POA: Insufficient documentation

## 2017-12-08 DIAGNOSIS — I10 Essential (primary) hypertension: Secondary | ICD-10-CM | POA: Insufficient documentation

## 2017-12-08 DIAGNOSIS — R110 Nausea: Secondary | ICD-10-CM | POA: Insufficient documentation

## 2017-12-08 LAB — CBG MONITORING, ED: Glucose-Capillary: 124 mg/dL — ABNORMAL HIGH (ref 70–99)

## 2017-12-08 MED ORDER — OXYMETAZOLINE HCL 0.05 % NA SOLN
1.0000 | Freq: Once | NASAL | Status: AC
Start: 2017-12-08 — End: 2017-12-08
  Administered 2017-12-08: 1 via NASAL
  Filled 2017-12-08: qty 15

## 2017-12-08 MED ORDER — LIDOCAINE-EPINEPHRINE-TETRACAINE (LET) SOLUTION
3.0000 mL | Freq: Once | NASAL | Status: AC
  Administered 2017-12-08: 3 mL via TOPICAL
  Filled 2017-12-08: qty 3

## 2017-12-08 MED ORDER — ONDANSETRON 4 MG PO TBDP
4.0000 mg | ORAL_TABLET | Freq: Once | ORAL | Status: AC
  Administered 2017-12-08: 4 mg via ORAL
  Filled 2017-12-08: qty 1

## 2017-12-08 NOTE — ED Notes (Signed)
Patient verbalizes understanding of discharge instructions. Opportunity for questioning and answers were provided. Armband removed by staff, pt discharged from ED. Left with papers via wheelchair

## 2017-12-08 NOTE — ED Provider Notes (Signed)
[facility]   CSN: [id number] Arrival date & time: 12/08/17  [id number]     History   Chief Complaint Chief Complaint  Patient presents with  . Epistaxis    HPI Rachel Avila is a 52 y.o. female.  HPI   52yF with epistaxis. Onset around 0330 today. Initially she felt it was under control but then got up to use the bathroom when it began "gushing" again. Hx of nose bleeds as child. Nothing significant as adult. She has tried ice packs and holding pressure. She has been spitting up blood and feels nauseated. She is not anticoagulated.   Past Medical History:  Diagnosis Date  . Diabetes mellitus   . Hyperlipidemia   . Hypertension   . Obesity     Patient Active Problem List   Diagnosis Date Noted  . Diabetes mellitus   . DM 11/02/2008  . OBESITY, MORBID 11/02/2008  . ANEMIA 11/02/2008  . AMI 11/02/2008  . ELEVATED BLOOD PRESSURE WITHOUT DIAGNOSIS OF HYPERTENSION 11/02/2008    Past Surgical History:  Procedure Laterality Date  . CESAREAN SECTION       OB History   None      Home Medications    Prior to Admission medications   Medication Sig Start Date End Date Taking? Authorizing Provider  amLODipine (NORVASC) 2.5 MG tablet Take 2.5 mg by mouth daily.      [provider]  aspirin 81 MG tablet Take 81 mg by mouth daily. Take 1 pill by mouth daily 30 minutes before you take niaspan     [provider]  azithromycin (ZITHROMAX) 250 MG tablet Take 1 tablet (250 mg total) by mouth daily. Take first 2 tablets together, then 1 every day until finished. 01/03/13   Vanetta MuldersZackowski, Scott, MD  ferrous sulfate 325 (65 FE) MG EC tablet Take 325 mg by mouth 2 (two) times daily with a meal.      [provider]  lisinopril (PRINIVIL,ZESTRIL) 10 MG tablet Take 10 mg by mouth daily.      [provider]  metFORMIN (GLUCOPHAGE) 500 MG tablet Take 500 mg by mouth every morning.     [provider]  metoprolol (TOPROL-XL) 50 MG 24 hr tablet Take 50 mg by mouth daily.      [provider]  nitroGLYCERIN (NITROSTAT) 0.4 MG SL tablet Place 0.4 mg under the tongue every 5 (five) minutes as needed. For chest pain    [provider]    Family History History reviewed. No pertinent family history.  Social History Social History   Tobacco Use  . Smoking status: Former Smoker    Packs/day: 0.50  Substance Use Topics  . Alcohol use: No  . Drug use: Not on file     Allergies   Bee venom   Review of Systems Review of Systems  All systems reviewed and negative, other than as noted in HPI.  Physical Exam Updated Vital Signs BP (!) 225/98 (BP Location: Right Arm)   Pulse 70   Temp 98.1 F (36.7 C) (Oral)   Resp 19   Ht 5' 7.5" (1.715 m)   Wt (!) 189.6 kg   LMP 11/24/2017   SpO2 99%   BMI 64.50 kg/m   Physical Exam  Constitutional: She appears well-developed and well-nourished. No distress.  HENT:  Very brisk bleeding from the right side.  Likely posterior although I cannot visualize very well given the volume of bleeding  despite constant suction.  Significant blood noted in the posterior pharynx.  Patient is spitting up blood.  Minimal blood from the left side.  Eyes: Conjunctivae are normal. Right eye exhibits no discharge. Left eye exhibits no discharge.  Neck: Neck supple.  Cardiovascular: Normal rate, regular rhythm and normal heart sounds. Exam reveals no gallop and no friction rub.  No murmur heard. Pulmonary/Chest: Effort normal and breath sounds normal. No respiratory distress.  Abdominal: Soft. She exhibits no distension. There is no tenderness.  Musculoskeletal: She exhibits no edema or tenderness.  Neurological: She is alert.  Skin: Skin is warm and dry.  Psychiatric: She has a normal mood and affect. Her behavior is normal. Thought content normal.  Nursing note and vitals reviewed.    ED Treatments / Results  Labs (all  labs ordered are listed, but only abnormal results are displayed) Labs Reviewed  CBG MONITORING, ED - Abnormal; Notable for the following components:      Result Value   Glucose-Capillary 124 (*)    All other components within normal limits    EKG None  Radiology No results found.  Procedures .Epistaxis Management Date/Time: 12/08/2017 9:00 AM Performed by: Raeford Razor, MD Authorized by: Raeford Razor, MD   Consent:    Consent obtained:  Verbal   Consent given by:  Patient   Risks discussed:  Nasal injury and pain   Alternatives discussed:  Alternative treatment Anesthesia (see MAR for exact dosages):    Anesthesia method:  Topical application   Topical anesthetic:  Lidocaine gel Procedure details:    Treatment site:  R anterior   Treatment method:  Silver nitrate   Treatment complexity:  Extensive   Treatment episode: initial   Post-procedure details:    Assessment:  Bleeding stopped   Patient tolerance of procedure:  Tolerated well, no immediate complications   (including critical care time)  Medications Ordered in ED Medications  ondansetron (ZOFRAN-ODT) disintegrating tablet 4 mg (has no administration in time range)  lidocaine-EPINEPHrine-tetracaine (LET) solution (has no administration in time range)  oxymetazoline (AFRIN) 0.05 % nasal spray 1 spray (1 spray Each Nare Given 12/08/17 0752)     Initial Impression / Assessment and Plan / ED Course  I have reviewed the triage vital signs and the nursing notes.  Pertinent labs & imaging results that were available during my care of the patient were reviewed by me and considered in my medical decision making (see chart for details).     52 year old female with epistaxis.  Initially felt she may have a posterior bleed.  Must have also the bleeding and actually noted a small place on her nasal septum.  This is cauterized.  She is observed afterwards with no evidence of significant rebleeding.  Continued care  and return precautions discussed.  Final Clinical Impressions(s) / ED Diagnoses   Final diagnoses:  Epistaxis    ED Discharge Orders    None       Raeford Razor, MD 12/19/17 2208

## 2017-12-08 NOTE — ED Triage Notes (Signed)
Pt states that her nose started bleeding approx 330 this am; states it wld stop and start but drainage is being swallowed which prompted ER visit. Pt states she has had bleeding issues in her childhood. Pt states that she has had cauterization in past.

## 2017-12-08 NOTE — ED Notes (Signed)
Pt's BP consistently high. MD aware, ok to d/c per Rachel Avila

## 2019-10-14 ENCOUNTER — Inpatient Hospital Stay (HOSPITAL_COMMUNITY): Payer: Medicaid Other

## 2019-10-14 ENCOUNTER — Emergency Department (HOSPITAL_COMMUNITY): Payer: Medicaid Other

## 2019-10-14 ENCOUNTER — Inpatient Hospital Stay (HOSPITAL_COMMUNITY)
Admission: EM | Admit: 2019-10-14 | Discharge: 2019-10-17 | DRG: 064 | Disposition: E | Payer: Self-pay | Attending: Pulmonary Disease | Admitting: Pulmonary Disease

## 2019-10-14 DIAGNOSIS — Z66 Do not resuscitate: Secondary | ICD-10-CM | POA: Diagnosis not present

## 2019-10-14 DIAGNOSIS — Z79899 Other long term (current) drug therapy: Secondary | ICD-10-CM

## 2019-10-14 DIAGNOSIS — R34 Anuria and oliguria: Secondary | ICD-10-CM | POA: Diagnosis not present

## 2019-10-14 DIAGNOSIS — R569 Unspecified convulsions: Secondary | ICD-10-CM | POA: Diagnosis present

## 2019-10-14 DIAGNOSIS — R0902 Hypoxemia: Secondary | ICD-10-CM

## 2019-10-14 DIAGNOSIS — I10 Essential (primary) hypertension: Secondary | ICD-10-CM | POA: Diagnosis present

## 2019-10-14 DIAGNOSIS — R778 Other specified abnormalities of plasma proteins: Secondary | ICD-10-CM | POA: Diagnosis present

## 2019-10-14 DIAGNOSIS — J9600 Acute respiratory failure, unspecified whether with hypoxia or hypercapnia: Secondary | ICD-10-CM

## 2019-10-14 DIAGNOSIS — I071 Rheumatic tricuspid insufficiency: Secondary | ICD-10-CM | POA: Diagnosis present

## 2019-10-14 DIAGNOSIS — D509 Iron deficiency anemia, unspecified: Secondary | ICD-10-CM | POA: Diagnosis present

## 2019-10-14 DIAGNOSIS — E119 Type 2 diabetes mellitus without complications: Secondary | ICD-10-CM | POA: Diagnosis present

## 2019-10-14 DIAGNOSIS — N179 Acute kidney failure, unspecified: Secondary | ICD-10-CM | POA: Diagnosis present

## 2019-10-14 DIAGNOSIS — E785 Hyperlipidemia, unspecified: Secondary | ICD-10-CM | POA: Diagnosis present

## 2019-10-14 DIAGNOSIS — G935 Compression of brain: Secondary | ICD-10-CM | POA: Diagnosis present

## 2019-10-14 DIAGNOSIS — R4020 Unspecified coma: Secondary | ICD-10-CM | POA: Diagnosis present

## 2019-10-14 DIAGNOSIS — Z91030 Bee allergy status: Secondary | ICD-10-CM

## 2019-10-14 DIAGNOSIS — Z452 Encounter for adjustment and management of vascular access device: Secondary | ICD-10-CM

## 2019-10-14 DIAGNOSIS — R7989 Other specified abnormal findings of blood chemistry: Secondary | ICD-10-CM | POA: Diagnosis present

## 2019-10-14 DIAGNOSIS — Z6841 Body Mass Index (BMI) 40.0 and over, adult: Secondary | ICD-10-CM

## 2019-10-14 DIAGNOSIS — Z7982 Long term (current) use of aspirin: Secondary | ICD-10-CM

## 2019-10-14 DIAGNOSIS — I959 Hypotension, unspecified: Secondary | ICD-10-CM | POA: Diagnosis present

## 2019-10-14 DIAGNOSIS — G936 Cerebral edema: Secondary | ICD-10-CM | POA: Diagnosis present

## 2019-10-14 DIAGNOSIS — Z20822 Contact with and (suspected) exposure to covid-19: Secondary | ICD-10-CM | POA: Diagnosis present

## 2019-10-14 DIAGNOSIS — Z7189 Other specified counseling: Secondary | ICD-10-CM

## 2019-10-14 DIAGNOSIS — Z87891 Personal history of nicotine dependence: Secondary | ICD-10-CM

## 2019-10-14 DIAGNOSIS — I469 Cardiac arrest, cause unspecified: Secondary | ICD-10-CM | POA: Diagnosis present

## 2019-10-14 DIAGNOSIS — I606 Nontraumatic subarachnoid hemorrhage from other intracranial arteries: Principal | ICD-10-CM | POA: Diagnosis present

## 2019-10-14 DIAGNOSIS — G931 Anoxic brain damage, not elsewhere classified: Secondary | ICD-10-CM | POA: Diagnosis present

## 2019-10-14 DIAGNOSIS — Z515 Encounter for palliative care: Secondary | ICD-10-CM

## 2019-10-14 DIAGNOSIS — I4891 Unspecified atrial fibrillation: Secondary | ICD-10-CM | POA: Diagnosis present

## 2019-10-14 DIAGNOSIS — J9601 Acute respiratory failure with hypoxia: Secondary | ICD-10-CM

## 2019-10-14 DIAGNOSIS — J811 Chronic pulmonary edema: Secondary | ICD-10-CM | POA: Diagnosis present

## 2019-10-14 DIAGNOSIS — I609 Nontraumatic subarachnoid hemorrhage, unspecified: Secondary | ICD-10-CM

## 2019-10-14 LAB — CBC
HCT: 36.1 % (ref 36.0–46.0)
Hemoglobin: 9.3 g/dL — ABNORMAL LOW (ref 12.0–15.0)
MCH: 16.4 pg — ABNORMAL LOW (ref 26.0–34.0)
MCHC: 25.8 g/dL — ABNORMAL LOW (ref 30.0–36.0)
MCV: 63.6 fL — ABNORMAL LOW (ref 80.0–100.0)
Platelets: 307 10*3/uL (ref 150–400)
RBC: 5.68 MIL/uL — ABNORMAL HIGH (ref 3.87–5.11)
RDW: 22 % — ABNORMAL HIGH (ref 11.5–15.5)
WBC: 12.8 10*3/uL — ABNORMAL HIGH (ref 4.0–10.5)
nRBC: 0 % (ref 0.0–0.2)

## 2019-10-14 LAB — CBC WITH DIFFERENTIAL/PLATELET
Abs Immature Granulocytes: 0.17 10*3/uL — ABNORMAL HIGH (ref 0.00–0.07)
Basophils Absolute: 0.1 10*3/uL (ref 0.0–0.1)
Basophils Relative: 1 %
Eosinophils Absolute: 0.3 10*3/uL (ref 0.0–0.5)
Eosinophils Relative: 3 %
HCT: 41.3 % (ref 36.0–46.0)
Hemoglobin: 10.2 g/dL — ABNORMAL LOW (ref 12.0–15.0)
Immature Granulocytes: 2 %
Lymphocytes Relative: 31 %
Lymphs Abs: 2.9 10*3/uL (ref 0.7–4.0)
MCH: 15.9 pg — ABNORMAL LOW (ref 26.0–34.0)
MCHC: 24.7 g/dL — ABNORMAL LOW (ref 30.0–36.0)
MCV: 64.5 fL — ABNORMAL LOW (ref 80.0–100.0)
Monocytes Absolute: 0.5 10*3/uL (ref 0.1–1.0)
Monocytes Relative: 6 %
Neutro Abs: 5.3 10*3/uL (ref 1.7–7.7)
Neutrophils Relative %: 57 %
Platelets: 333 10*3/uL (ref 150–400)
RBC: 6.4 MIL/uL — ABNORMAL HIGH (ref 3.87–5.11)
RDW: 22.2 % — ABNORMAL HIGH (ref 11.5–15.5)
WBC: 9.2 10*3/uL (ref 4.0–10.5)
nRBC: 0 % (ref 0.0–0.2)

## 2019-10-14 LAB — BASIC METABOLIC PANEL
Anion gap: 10 (ref 5–15)
Anion gap: 11 (ref 5–15)
BUN: 14 mg/dL (ref 6–20)
BUN: 15 mg/dL (ref 6–20)
CO2: 28 mmol/L (ref 22–32)
CO2: 26 mmol/L (ref 22–32)
Calcium: 8.3 mg/dL — ABNORMAL LOW (ref 8.9–10.3)
Calcium: 8.4 mg/dL — ABNORMAL LOW (ref 8.9–10.3)
Chloride: 104 mmol/L (ref 98–111)
Chloride: 104 mmol/L (ref 98–111)
Creatinine, Ser: 1.24 mg/dL — ABNORMAL HIGH (ref 0.44–1.00)
Creatinine, Ser: 1.01 mg/dL — ABNORMAL HIGH (ref 0.44–1.00)
GFR calc Af Amer: 58 mL/min — ABNORMAL LOW (ref >60)
GFR calc Af Amer: >60 mL/min (ref >60)
GFR calc non Af Amer: 50 mL/min — ABNORMAL LOW (ref >60)
GFR calc non Af Amer: >60 mL/min (ref >60)
Glucose, Bld: 170 mg/dL — ABNORMAL HIGH (ref 70–99)
Glucose, Bld: 134 mg/dL — ABNORMAL HIGH (ref 70–99)
Potassium: 3.4 mmol/L — ABNORMAL LOW (ref 3.5–5.1)
Potassium: 3.6 mmol/L (ref 3.5–5.1)
Sodium: 142 mmol/L (ref 135–145)
Sodium: 141 mmol/L (ref 135–145)

## 2019-10-14 LAB — I-STAT ARTERIAL BLOOD GAS, ED
Acid-base deficit: 1 mmol/L (ref 0.0–2.0)
Bicarbonate: 27.8 mmol/L (ref 20.0–28.0)
Calcium, Ion: 1.15 mmol/L (ref 1.15–1.40)
HCT: 38 % (ref 36.0–46.0)
Hemoglobin: 12.9 g/dL (ref 12.0–15.0)
O2 Saturation: 90 %
Potassium: 3 mmol/L — ABNORMAL LOW (ref 3.5–5.1)
Sodium: 145 mmol/L (ref 135–145)
TCO2: 30 mmol/L (ref 22–32)
pCO2 arterial: 64.3 mmHg — ABNORMAL HIGH (ref 32.0–48.0)
pH, Arterial: 7.243 — ABNORMAL LOW (ref 7.350–7.450)
pO2, Arterial: 69 mmHg — ABNORMAL LOW (ref 83.0–108.0)

## 2019-10-14 LAB — TROPONIN I (HIGH SENSITIVITY)
Troponin I (High Sensitivity): 48 ng/L — ABNORMAL HIGH (ref <18)
Troponin I (High Sensitivity): 714 ng/L (ref <18)
Troponin I (High Sensitivity): 2982 ng/L (ref <18)

## 2019-10-14 LAB — COMPREHENSIVE METABOLIC PANEL
ALT: 19 U/L (ref 0–44)
AST: 41 U/L (ref 15–41)
Albumin: 3.4 g/dL — ABNORMAL LOW (ref 3.5–5.0)
Alkaline Phosphatase: 74 U/L (ref 38–126)
Anion gap: 12 (ref 5–15)
BUN: 12 mg/dL (ref 6–20)
CO2: 26 mmol/L (ref 22–32)
Calcium: 8.2 mg/dL — ABNORMAL LOW (ref 8.9–10.3)
Chloride: 102 mmol/L (ref 98–111)
Creatinine, Ser: 1.24 mg/dL — ABNORMAL HIGH (ref 0.44–1.00)
GFR calc Af Amer: 58 mL/min — ABNORMAL LOW (ref >60)
GFR calc non Af Amer: 50 mL/min — ABNORMAL LOW (ref >60)
Glucose, Bld: 256 mg/dL — ABNORMAL HIGH (ref 70–99)
Potassium: 3.5 mmol/L (ref 3.5–5.1)
Sodium: 140 mmol/L (ref 135–145)
Total Bilirubin: 0.7 mg/dL (ref 0.3–1.2)
Total Protein: 6.9 g/dL (ref 6.5–8.1)

## 2019-10-14 LAB — POCT I-STAT 7, (LYTES, BLD GAS, ICA,H+H)
Acid-Base Excess: 4 mmol/L — ABNORMAL HIGH (ref 0.0–2.0)
Bicarbonate: 29.1 mmol/L — ABNORMAL HIGH (ref 20.0–28.0)
Calcium, Ion: 1.13 mmol/L — ABNORMAL LOW (ref 1.15–1.40)
HCT: 36 % (ref 36.0–46.0)
Hemoglobin: 12.2 g/dL (ref 12.0–15.0)
O2 Saturation: 98 %
Patient temperature: 98.6
Potassium: 3.3 mmol/L — ABNORMAL LOW (ref 3.5–5.1)
Sodium: 144 mmol/L (ref 135–145)
TCO2: 30 mmol/L (ref 22–32)
pCO2 arterial: 44.6 mmHg (ref 32.0–48.0)
pH, Arterial: 7.422 (ref 7.350–7.450)
pO2, Arterial: 108 mmHg (ref 83.0–108.0)

## 2019-10-14 LAB — HIV ANTIBODY (ROUTINE TESTING W REFLEX): HIV Screen 4th Generation wRfx: NONREACTIVE

## 2019-10-14 LAB — MAGNESIUM
Magnesium: 1.9 mg/dL (ref 1.7–2.4)
Magnesium: 1.8 mg/dL (ref 1.7–2.4)

## 2019-10-14 LAB — GLUCOSE, CAPILLARY
Glucose-Capillary: 118 mg/dL — ABNORMAL HIGH (ref 70–99)
Glucose-Capillary: 125 mg/dL — ABNORMAL HIGH (ref 70–99)
Glucose-Capillary: 136 mg/dL — ABNORMAL HIGH (ref 70–99)
Glucose-Capillary: 146 mg/dL — ABNORMAL HIGH (ref 70–99)
Glucose-Capillary: 150 mg/dL — ABNORMAL HIGH (ref 70–99)
Glucose-Capillary: 164 mg/dL — ABNORMAL HIGH (ref 70–99)

## 2019-10-14 LAB — APTT: aPTT: 24 seconds (ref 24–36)

## 2019-10-14 LAB — PROTIME-INR
INR: 1.1 (ref 0.8–1.2)
Prothrombin Time: 13.7 seconds (ref 11.4–15.2)

## 2019-10-14 LAB — PHOSPHORUS: Phosphorus: 5.1 mg/dL — ABNORMAL HIGH (ref 2.5–4.6)

## 2019-10-14 LAB — LACTIC ACID, PLASMA
Lactic Acid, Venous: 5 mmol/L (ref 0.5–1.9)
Lactic Acid, Venous: 2.1 mmol/L (ref 0.5–1.9)

## 2019-10-14 LAB — RESPIRATORY PANEL BY RT PCR (FLU A&B, COVID)
Influenza A by PCR: NEGATIVE
Influenza B by PCR: NEGATIVE
SARS Coronavirus 2 by RT PCR: NEGATIVE

## 2019-10-14 LAB — MRSA PCR SCREENING: MRSA by PCR: NEGATIVE

## 2019-10-14 MED ORDER — HEPARIN SODIUM (PORCINE) 5000 UNIT/ML IJ SOLN: 5000.0000 [IU] | Freq: Three times a day (TID) | INTRAMUSCULAR | Status: DC

## 2019-10-14 MED ORDER — ASPIRIN 300 MG RE SUPP: 300.0000 mg | RECTAL | Status: DC

## 2019-10-14 MED ORDER — PANTOPRAZOLE SODIUM 40 MG IV SOLR
40.0000 mg | Freq: Every day | INTRAVENOUS | Status: DC
  Administered 2019-10-14 – 2019-10-15 (×2): 40 mg via INTRAVENOUS
  Filled 2019-10-14 (×2): qty 40

## 2019-10-14 MED ORDER — POLYETHYLENE GLYCOL 3350 17 G PO PACK: 17.0000 g | PACK | Freq: Every day | ORAL | Status: DC | PRN

## 2019-10-14 MED ORDER — NOREPINEPHRINE 4 MG/250ML-% IV SOLN
5.0000 ug/min | INTRAVENOUS | Status: DC
  Administered 2019-10-14: 5 ug/min via INTRAVENOUS
  Administered 2019-10-14: 2 ug/min via INTRAVENOUS
  Administered 2019-10-15: 5 ug/min via INTRAVENOUS
  Administered 2019-10-16: 7 ug/min via INTRAVENOUS
  Administered 2019-10-16: 31 ug/min via INTRAVENOUS
  Administered 2019-10-16: 45 ug/min via INTRAVENOUS
  Filled 2019-10-14 (×4): qty 250

## 2019-10-14 MED ORDER — DOCUSATE SODIUM 100 MG PO CAPS: 100.0000 mg | ORAL_CAPSULE | Freq: Two times a day (BID) | ORAL | Status: DC | PRN

## 2019-10-14 MED ORDER — VASOPRESSIN 20 UNITS/100 ML INFUSION FOR SHOCK
0.0000 [IU]/min | INTRAVENOUS | Status: DC
  Administered 2019-10-14 – 2019-10-16 (×4): 0.03 [IU]/min via INTRAVENOUS
  Filled 2019-10-14 (×6): qty 100

## 2019-10-14 MED ORDER — ONDANSETRON HCL 4 MG/2ML IJ SOLN: 4.0000 mg | Freq: Four times a day (QID) | INTRAMUSCULAR | Status: DC | PRN

## 2019-10-14 MED ORDER — EPINEPHRINE 1 MG/10ML IJ SOSY
PREFILLED_SYRINGE | INTRAMUSCULAR | Status: AC | PRN
  Administered 2019-10-14: 1 mg via INTRAVENOUS

## 2019-10-14 MED ORDER — SODIUM CHLORIDE 0.9 % IV SOLN: 250.0000 mL | INTRAVENOUS | Status: DC

## 2019-10-14 MED ORDER — POTASSIUM CHLORIDE 20 MEQ/15ML (10%) PO SOLN: 20.0000 meq | ORAL | Status: DC

## 2019-10-14 MED ORDER — NOREPINEPHRINE 4 MG/250ML-% IV SOLN
INTRAVENOUS | Status: AC
  Filled 2019-10-14: qty 250

## 2019-10-14 MED ORDER — NOREPINEPHRINE 4 MG/250ML-% IV SOLN
0.0000 ug/min | INTRAVENOUS | Status: DC
  Administered 2019-10-14: 02:00:00 20 ug/min via INTRAVENOUS

## 2019-10-14 MED ORDER — POTASSIUM CHLORIDE 10 MEQ/100ML IV SOLN: 10.0000 meq | INTRAVENOUS | Status: DC

## 2019-10-14 MED ORDER — SODIUM CHLORIDE 0.9 % IV SOLN: INTRAVENOUS | Status: DC

## 2019-10-14 MED ORDER — ORAL CARE MOUTH RINSE
15.0000 mL | OROMUCOSAL | Status: DC
  Administered 2019-10-14 – 2019-10-16 (×20): 15 mL via OROMUCOSAL

## 2019-10-14 MED ORDER — POTASSIUM CHLORIDE 10 MEQ/50ML IV SOLN
10.0000 meq | INTRAVENOUS | Status: AC
  Administered 2019-10-14 (×4): 10 meq via INTRAVENOUS
  Filled 2019-10-14 (×4): qty 50

## 2019-10-14 MED ORDER — CHLORHEXIDINE GLUCONATE 0.12% ORAL RINSE (MEDLINE KIT)
15.0000 mL | Freq: Two times a day (BID) | OROMUCOSAL | Status: DC
  Administered 2019-10-14 – 2019-10-16 (×5): 15 mL via OROMUCOSAL

## 2019-10-14 MED ORDER — CHLORHEXIDINE GLUCONATE CLOTH 2 % EX PADS
6.0000 | MEDICATED_PAD | Freq: Every day | CUTANEOUS | Status: DC
  Administered 2019-10-14 – 2019-10-16 (×3): 6 via TOPICAL

## 2019-10-14 MED ORDER — EPINEPHRINE HCL 5 MG/250ML IV SOLN IN NS
0.5000 ug/min | INTRAVENOUS | Status: DC
  Administered 2019-10-14: 0.5 ug/min via INTRAVENOUS
  Filled 2019-10-14: qty 250

## 2019-10-14 MED FILL — Sodium Chloride IV Soln 0.9%: INTRAVENOUS | Qty: 100 | Status: AC

## 2019-10-14 MED FILL — Vasopressin IV Soln 20 Unit/ML (For IV Infusion): INTRAVENOUS | Qty: 1 | Status: AC

## 2019-10-14 MED FILL — Medication: Qty: 1 | Status: AC

## 2019-10-14 NOTE — ED Notes (Deleted)
Rachel Avila-MEDICAL EXAMINER called for Dr.Wickline

## 2019-10-14 NOTE — Progress Notes (Signed)
NAME:  Rachel Avila, MRN:  [id number], DOB:  March 29, 1965, LOS: 0 ADMISSION DATE:  10/13/2019,  CHIEF COMPLAINT:  Cardiac arrest  Brief History   Seizure that was followed by cardiac arrest  History of present illness   54 y/o black female that presented to the ER from home.  The pt had a witnessed generalized seizure that was followed by a cardiac arrest event.  There are limited details available to the events that lead up to the cardiac arrest.  EMS found her in cardiac arrest.  They were able to obtain ROSC prior to arrival to the ER.She converted to sinus rhythm. Pt was converted from [facility] airway to ETT while in the ER.  Past Medical History  HTN DM Morbid obesity Anemia AMI  Consults:  Neurology cardiology  Procedures:  TLC  Significant Diagnostic Tests:  CT head 09/28 IMPRESSION: 1. 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 2. Diffuse cerebral edema.   Interim:  Patient unresponsive on mechanical ventilation. Brainstem reflexes NOT intact. Does initiate some respiratory breaths on her own. Very grim prognosis.  Had an extensive discussion with patient's family. She is unmarried, so decision makers while patient is incapacitated would be her two children (one son and one daughter). Daughter is 8 and son is 73. Encouraged group family decision making to best represent patient's own wishes. Family requested more time to come to a decision about code status.  Unfortunately, at this time, there are no interventions that we can offer the patient to provide any meaningful recovery.   Objective   Blood pressure (!) 131/94, pulse 68, temperature (!) 96.3 F (35.7 C), resp. rate (!) 24, height 5\' 6"  (1.676 m), SpO2 93 %.    Vent Mode: PRVC FiO2 (%):  [80 %-100 %] 80 % Set Rate:  [18 bmp-24 bmp] 24 bmp Vt Set:  [470 mL] 470 mL PEEP:  [5 cmH20-10 cmH20] 8 cmH20 Plateau Pressure:  [25 cmH20-30 cmH20] 27 cmH20     Intake/Output Summary (Last 24 hours) at 10/12/2019 1411 Last data filed at 09/19/2019 1354 Gross per 24 hour  Intake 192.99 ml  Output 180 ml  Net 12.99 ml   There were no vitals filed for this visit.  Examination: General: critically ill middle-aged female, unresponsive on mechanical ventilation. HENT: mucous membranes pink and moist. Eyes: pupils dilated, unresponsive to light. No corneal reflex bilaterally. Doll's eyes. CV: normal rate and regular rhythm, no m/r/g Lungs: mechanically ventilated breath sounds. Abdomen: morbidly obese. Soft, non-distended. +BS. Extremities: no edema Neuro: GCS 3, unresponsive, not on any sedation.   Assessment & Plan:  Cardiac Arrest Catastrophic SAH Right parasellar aneurysm  Morbid obesity AKI  Very grim prognosis unfortunately. Will likely progress to brain death. Will assess for 24 hours to see if there is any improvement in neuro status.   Patient is FULL code at this time. Will need to continue goals of care discussions with family.  Plan: -Stopped TTM as it is contraindicated in active ICH -On levophed and vasopressin -continue vent management -Wean fio2 for sats >92% -Neurology following -No meaningful interventions that can be offered at this time -Will order spot EEG to rule out subclinical seizures -Continue goals of care conversations with family -Hold anticoagulation in the setting of active SAH   Best practice:  Diet: NPO  Pain/Anxiety/Delirium protocol (if indicated): Prop/ fentanyl if needed VAP protocol (if indicated): started DVT prophylaxis: SCD GI prophylaxis: Protonix   Glucose control: insulin  SS Mobility: bedrest Code Status:Full Family Communication: Updated the pt's son, daughter, mother, and brother at bedside.  Explained medical dx, prognosis and plan of care. Disposition: Admit to ICU  Labs   CBC: Recent Labs  Lab 09/21/2019 0052 10/08/2019 0131 09/25/2019 0337 10/07/2019 0445  WBC 9.2  --  12.8*   --   NEUTROABS 5.3  --   --   --   HGB 10.2* 12.9 9.3* 12.2  HCT 41.3 38.0 36.1 36.0  MCV 64.5*  --  63.6*  --   PLT 333  --  307  --     Basic Metabolic Panel: Recent Labs  Lab 10/12/2019 0052 10/15/2019 0131 09/17/2019 0337 10/05/2019 0445 09/20/2019 0806  NA 140 145 142 144 141  K 3.5 3.0* 3.4* 3.3* 3.6  CL 102  --  104  --  104  CO2 26  --  28  --  26  GLUCOSE 256*  --  170*  --  134*  BUN 12  --  14  --  15  CREATININE 1.24*  --  1.24*  --  1.01*  CALCIUM 8.2*  --  8.3*  --  8.4*  MG 1.9  --  1.8  --   --   PHOS  --   --  5.1*  --   --    GFR: CrCl cannot be calculated (Unknown ideal weight.). Recent Labs  Lab 10/05/2019 0052 10/11/2019 0245 10/15/2019 0337  WBC 9.2  --  12.8*  LATICACIDVEN 5.0* 2.1*  --     Liver Function Tests: Recent Labs  Lab 10/15/2019 0052  AST 41  ALT 19  ALKPHOS 74  BILITOT 0.7  PROT 6.9  ALBUMIN 3.4*   No results for input(s): LIPASE, AMYLASE in the last 168 hours. No results for input(s): AMMONIA in the last 168 hours.  ABG    Component Value Date/Time   PHART 7.422 09/29/2019 0445   PCO2ART 44.6 10/13/2019 0445   PO2ART 108 09/23/2019 0445   HCO3 29.1 (H) 10/04/2019 0445   TCO2 30 10/05/2019 0445   ACIDBASEDEF 1.0 10/05/2019 0131   O2SAT 98.0 10/09/2019 0445     Coagulation Profile: Recent Labs  Lab 10/15/2019 0337  INR 1.1    Cardiac Enzymes: No results for input(s): CKTOTAL, CKMB, CKMBINDEX, TROPONINI in the last 168 hours.  HbA1C: Hgb A1c MFr Bld  Date/Time Value Ref Range Status  09/23/2008 10:11 AM (H) 4.6 - 6.1 % Final   6.7 (NOTE) The ADA recommends the following therapeutic goal for glycemic control related to Hgb A1c measurement: Goal of therapy: <6.5 Hgb A1c  Reference: American Diabetes Association: Clinical Practice Recommendations 2010, Diabetes Care, 2010, 33: (Suppl  1).  09/23/2008 12:00 AM 6.7 %     CBG: Recent Labs  Lab 10/09/2019 0336 10/15/2019 0806 10/06/2019 1126  GLUCAP 164* 136* 150*    Review  of Systems:   Unable to complete due to neuro status  Past Medical History  She,  has a past medical history of Diabetes mellitus, Hyperlipidemia, Hypertension, and Obesity.   Surgical History    Past Surgical History:  Procedure Laterality Date   CESAREAN SECTION       Social History   reports that she has quit smoking. She smoked 0.50 packs per day. She does not have any smokeless tobacco history on file. She reports that she does not drink alcohol.   Family History   Her family history is not on file.   Allergies Allergies  Allergen Reactions   Bee Venom Swelling     Home Medications  Prior to Admission medications   Medication Sig Start Date End Date Taking? Authorizing Provider  aspirin 81 MG tablet Take 81 mg by mouth daily. Take 1 pill by mouth daily 30 minutes before you take niaspan     [provider]  Multiple Vitamin (MULTIVITAMIN WITH MINERALS) TABS tablet Take 1 tablet by mouth daily.    [provider]  Multiple Vitamins-Minerals (AIRBORNE) CHEW Chew 1 tablet by mouth daily.    [provider]     Critical care time: 50 minutes     Merrilyn Puma, MD 09/24/2019, 2:11 PM

## 2019-10-14 NOTE — ED Provider Notes (Signed)
[facility] EMERGENCY DEPARTMENT Provider Note   CSN: [id number] Arrival date & time: 10/08/2019  0032   History Chief Complaint  Patient presents with  . Cardiac Arrest    Rachel Avila is a 54 y.o. female.  The history is provided by the EMS personnel. The history is limited by the condition of the patient (Patient unresponsive).  Cardiac Arrest She has history of hypertension, diabetes, hyperlipidemia, morbid obesity and is brought in following out of hospital cardiac arrest.  EMS was called out for a seizure, fire rescue noted patient was apneic and pulseless and started CPR.  EMS arrived and noted asystole and gave epinephrine.  Following that, patient went into rapid atrial fibrillation which was cardioverted x2 with eventual conversion to sinus rhythm.  She did have return of spontaneous circulation, but heart rate would decline and patient would become pulseless and responded to additional epinephrine.  This happened several additional times.  Of note, left tibial intraosseous line was inserted as well as King airway.  Past Medical History:  Diagnosis Date  . Diabetes mellitus   . Hyperlipidemia   . Hypertension   . Obesity     Patient Active Problem List   Diagnosis Date Noted  . Diabetes mellitus   . DM 11/02/2008  . OBESITY, MORBID 11/02/2008  . ANEMIA 11/02/2008  . AMI 11/02/2008  . ELEVATED BLOOD PRESSURE WITHOUT DIAGNOSIS OF HYPERTENSION 11/02/2008    Past Surgical History:  Procedure Laterality Date  . CESAREAN SECTION       OB History   No obstetric history on file.     No family history on file.  Social History   Tobacco Use  . Smoking status: Former Smoker    Packs/day: 0.50  Substance Use Topics  . Alcohol use: No  . Drug use: Not on file    Home Medications Prior to Admission medications   Medication Sig Start Date End Date Taking? Authorizing Provider  aspirin 81 MG tablet Take 81 mg by mouth daily. Take 1 pill by  mouth daily 30 minutes before you take niaspan     [provider]  Multiple Vitamin (MULTIVITAMIN WITH MINERALS) TABS tablet Take 1 tablet by mouth daily.    [provider]  Multiple Vitamins-Minerals (AIRBORNE) CHEW Chew 1 tablet by mouth daily.    [provider]    Allergies    Bee venom  Review of Systems   Review of Systems  Unable to perform ROS: Patient unresponsive    Physical Exam Updated Vital Signs BP (!) 80/50   Pulse 87   Resp 18   Ht 5\' 6"  (1.676 m)   SpO2 100%   BMI 67.47 kg/m   Physical Exam Vitals and nursing note reviewed.   Morbidly obese 54 year old female, resting comfortably and in no acute distress. Vital signs are significant for low blood pressure. Oxygen saturation is 100%, which is normal. Head is normocephalic and atraumatic. PERRLA. King airway is in place. Neck has no JVD. Lungs have symmetric airflow. Chest has no deformity or crepitus. Heart has regular rate and rhythm without murmur. Abdomen is soft, flat. Extremities: Trace edema, no obvious deformities.  Intraosseous line present in the left tibia. Skin is warm and dry without rash. Neurologic: GCS=3.  ED Results / Procedures / Treatments   Labs (all labs ordered are listed, but only abnormal results are displayed) Labs Reviewed  RESPIRATORY PANEL BY RT PCR (FLU A&B, COVID)  COMPREHENSIVE METABOLIC PANEL  LACTIC ACID, PLASMA  LACTIC ACID, PLASMA  CBC WITH DIFFERENTIAL/PLATELET  MAGNESIUM  I-STAT ARTERIAL BLOOD GAS, ED  TROPONIN I (HIGH SENSITIVITY)    EKG EKG Interpretation  Date/Time:  Tuesday 28-Oct-2019 00:34:47 EDT Ventricular Rate:  90 PR Interval:    QRS Duration: 98 QT Interval:  397 QTC Calculation: 486 R Axis:   -4 Text Interpretation: Sinus rhythm Repol abnrm suggests ischemia, anterolateral When compared with ECG of 09/20/2008, T wave abnormality has improved Confirmed by Dione Booze ([id number]) on Oct 28, 2019 12:47:08  AM   Radiology No results found.  Procedures Date/Time: Oct 28, 2019 12:40 AM Performed by: Dione Booze, MD Pre-anesthesia Checklist: Patient identified, Emergency Drugs available, Suction available and Patient being monitored Oxygen Delivery Method: Ambu bag Preoxygenation: Pre-oxygenation with 100% oxygen Laryngoscope Size: Glidescope and 3 Grade View: Grade I Number of attempts: 1 Airway Equipment and Method: Rigid stylet and Video-laryngoscopy Placement Confirmation: ETT inserted through vocal cords under direct vision,  Breath sounds checked- equal and bilateral and CO2 detector Secured at: 23 cm Tube secured with: ETT holder Dental Injury: Teeth and Oropharynx as per pre-operative assessment        CRITICAL CARE Performed by: Dione Booze Total critical care time: 95 minutes Critical care time was exclusive of separately billable procedures and treating other patients. Critical care was necessary to treat or prevent imminent or life-threatening deterioration. Critical care was time spent personally by me on the following activities: development of treatment plan with patient and/or surrogate as well as nursing, discussions with consultants, evaluation of patient's response to treatment, examination of patient, obtaining history from patient or surrogate, ordering and performing treatments and interventions, ordering and review of laboratory studies, ordering and review of radiographic studies, pulse oximetry and re-evaluation of patient's condition.  Medications Ordered in ED Medications  EPINEPHrine (ADRENALIN) 4 mg in NS 250 mL (0.016 mg/mL) premix infusion (has no administration in time range)  EPINEPHrine (ADRENALIN) 1 MG/10ML injection (1 mg Intravenous Given 10/28/19 0041)    ED Course  I have reviewed the triage vital signs and the nursing notes.  Pertinent labs & imaging results that were available during my care of the patient were reviewed by me and considered in  my medical decision making (see chart for details).  MDM Rules/Calculators/A&P Patient with out of hospital cardiac arrest with return of spontaneous circulation.  Initial seizure could have been the initial precipitating event, or seizure could have been from anoxic encephalopathy from cardiac arrest.  In the ED, she shows a trend to heart rate decreasing along with blood pressure decreasing which responded to a dose of epinephrine and she is placed on an epinephrine drip.  King airway was removed and replaced with an endotracheal tube.  Chest x-ray shows good position of endotracheal tube, cardiomegaly, probable pulmonary vascular congestion per my reading, radiologist interpretation pending.  She will be sent for CT of head.  Cardiology has been consulted and she will need to be admitted to the critical care service.  Case has been discussed with Dr. Nicole Cella who has come in to see the patient.  CT scan has been obtained showing subarachnoid hemorrhage and probable large aneurysm.  It appears that this was the precipitating event.  Case has been discussed with Dr. Derry Lory of neurology service who agrees to see the patient in consultation.  Other labs show borderline elevated creatinine, mild anemia.  Lactic acid is elevated, not unexpected post CPR, not representing sepsis.  Mild elevation of troponin, not unexpected post CPR.  Final Clinical Impression(s) / ED Diagnoses Final diagnoses:  Cardiac arrest (HCC)  Seizure (HCC)  Subarachnoid hemorrhage (HCC)  Microcytic anemia  Elevated troponin  Elevated lactic acid level    Rx / DC Orders ED Discharge Orders    None       Dione Booze, MD 2019/10/31 [phone]

## 2019-10-14 NOTE — Progress Notes (Signed)
Pt transferred to 25m with no complecations.

## 2019-10-14 NOTE — Progress Notes (Signed)
eLink Physician-Brief Progress Note Patient Name: MCKYNLEE LUSE DOB: 1965/11/22 MRN: [id number]   Date of Service  Nov 06, 2019  HPI/Events of Note  Patient is not a candidate for TTM due to [facility].  eICU Interventions  Will D/C cooling orders.      Intervention Category Major Interventions: Other:  Damiean Lukes Dennard Nip Nov 06, 2019, 6:00 AM

## 2019-10-14 NOTE — Progress Notes (Signed)
EEG complete - results pending 

## 2019-10-14 NOTE — Consult Note (Signed)
[facility] Consult Note   Primary Physician: Primary Cardiologist:    Reason for Consultation: Asystole / PEA arrest  HPI:    Rachel Avila is a 54 year old female with a past medical history significant for morbid obesity, hypertension, hyperlipidemia and type 2 diabetes mellitus, who presented with an out-of-hospital arrest.  Last evening the patient had seizures and then became unresponsive.  On EMS arrival she was found to be apneic and pulseless.  CPR was performed.  For PEA/asystole she was given epinephrine.  At one point she developed rapid atrial fibrillation for which she was cardioverted x2.  CT head demonstrated aneurysm in the right parasellar region with extensive subarachnoid hemorrhage consistent with rupture.  It also demonstrated diffuse cerebral edema.  In the ED her labs were as follows: Potassium 3.5, glucose 256, creatinine 1.4, magnesium 1.9, high-sensitivity troponin 48, lactic acid 5.0, WBC 9.2, hemoglobin 10.2, and platelets 333.  ECG showed normal sinus rhythm, rate 90 bpm, and nonspecific T wave abnormalities.   Home Medications Prior to Admission medications   Medication Sig Start Date End Date Taking? Authorizing Provider  aspirin 81 MG tablet Take 81 mg by mouth daily. Take 1 pill by mouth daily 30 minutes before you take niaspan     [provider]  Multiple Vitamin (MULTIVITAMIN WITH MINERALS) TABS tablet Take 1 tablet by mouth daily.    [provider]  Multiple Vitamins-Minerals (AIRBORNE) CHEW Chew 1 tablet by mouth daily.    [provider]    Past Medical History: Past Medical History:  Diagnosis Date  . Diabetes mellitus   . Hyperlipidemia   . Hypertension   . Obesity     Past Surgical History: Past Surgical History:  Procedure Laterality Date  . CESAREAN SECTION      Family History: No family history on file.  Social History: Social History   Socioeconomic History  . Marital status: Single     Spouse name: Not on file  . Number of children: Not on file  . Years of education: Not on file  . Highest education level: Not on file  Occupational History  . Not on file  Tobacco Use  . Smoking status: Former Smoker    Packs/day: 0.50  Substance and Sexual Activity  . Alcohol use: No  . Drug use: Not on file  . Sexual activity: Not on file  Other Topics Concern  . Not on file  Social History Narrative  . Not on file   Social Determinants of Health   Financial Resource Strain:   . Difficulty of Paying Living Expenses: Not on file  Food Insecurity:   . Worried About Programme researcher, broadcasting/film/video in the Last Year: Not on file  . Ran Out of Food in the Last Year: Not on file  Transportation Needs:   . Lack of Transportation (Medical): Not on file  . Lack of Transportation (Non-Medical): Not on file  Physical Activity:   . Days of Exercise per Week: Not on file  . Minutes of Exercise per Session: Not on file  Stress:   . Feeling of Stress : Not on file  Social Connections:   . Frequency of Communication with Friends and Family: Not on file  . Frequency of Social Gatherings with Friends and Family: Not on file  . Attends Religious Services: Not on file  . Active Member of Clubs or Organizations: Not on file  . Attends Banker Meetings: Not on file  .  Marital Status: Not on file    Allergies:  Allergies  Allergen Reactions  . Bee Venom Swelling     . Review of Systems: Unable to perform   Objective:    Vital Signs:   Pulse Rate:  [80-96] 96 (09/28 0215) Resp:  [18-25] 24 (09/28 0215) BP: (73-148)/(45-102) 98/64 (09/28 0215) SpO2:  [90 %-100 %] 94 % (09/28 0215) FiO2 (%):  [100 %] 100 % (09/28 0100)    Weight change: There were no vitals filed for this visit.  Intake/Output:   Intake/Output Summary (Last 24 hours) at 09/21/2019 0245 Last data filed at 10/04/2019 0138 Gross per 24 hour  Intake 3.44 ml  Output --  Net 3.44 ml      Physical Exam     General: intubated. HENT: Normal oropharynx and mucosa. Normal external appearance of ears and nose. Neck: Supple, no pain or tenderness. CV: No JVD. No peripheral edema. Pulmonary: Symmetric Chest rise. Not breathing over the vent. Abdomen: Soft to touch, non-tender Ext: No cyanosis, edema, or deformity Skin: No rash. Normal palpation of skin. Musculoskeletal: Normal digits and nails by inspection. No clubbing. R foot bandaged.    Labs   Basic Metabolic Panel: Recent Labs  Lab 10/15/2019 0052 10/11/2019 0131  NA 140 145  K 3.5 3.0*  CL 102  --   CO2 26  --   GLUCOSE 256*  --   BUN 12  --   CREATININE 1.24*  --   CALCIUM 8.2*  --   MG 1.9  --     Liver Function Tests: Recent Labs  Lab 09/18/2019 0052  AST 41  ALT 19  ALKPHOS 74  BILITOT 0.7  PROT 6.9  ALBUMIN 3.4*   No results for input(s): LIPASE, AMYLASE in the last 168 hours. No results for input(s): AMMONIA in the last 168 hours.  CBC: Recent Labs  Lab 10/09/2019 0052 09/19/2019 0131  WBC 9.2  --   NEUTROABS 5.3  --   HGB 10.2* 12.9  HCT 41.3 38.0  MCV 64.5*  --   PLT 333  --     Cardiac Enzymes: No results for input(s): CKTOTAL, CKMB, CKMBINDEX, TROPONINI in the last 168 hours.  BNP: BNP (last 3 results) No results for input(s): BNP in the last 8760 hours.  ProBNP (last 3 results) No results for input(s): PROBNP in the last 8760 hours.   CBG: No results for input(s): GLUCAP in the last 168 hours.  Coagulation Studies: No results for input(s): LABPROT, INR in the last 72 hours.   Imaging   CT Head Wo Contrast  Result Date: 10/13/2019 CLINICAL DATA:  Seizure.  CPR. EXAM: CT HEAD WITHOUT CONTRAST TECHNIQUE: Contiguous axial images were obtained from the base of the skull through the vertex without intravenous contrast. COMPARISON:  None. FINDINGS: Brain: There is extensive subarachnoid hemorrhage in the suprasellar and perimesencephalic cisterns, extending into the sylvian fissures bilaterally,  the inter hemispheric fissure, and into the subarachnoid sulcal spaces. There is diffuse cerebral edema with cerebral low-attenuation, loss of gray-white junctions, effacement of sulci, ventricles, and basal cisterns. No midline shift. Vascular: Focal hematoma in the right parasellar region measuring 1.6 x 0.9 cm, likely representing an aneurysm. Skull: The calvarium appears intact. Sinuses/Orbits: Mild mucosal thickening in the paranasal sinuses. No acute air-fluid levels. Mastoid air cells are clear. Other: An oral tube is present, likely endotracheal tube. IMPRESSION: 1. 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 2. Diffuse cerebral edema.  Critical Value/emergent results were discussed at the workstation prior to the time of interpretation on 09/24/2019 at 2:16 am to provider [facility] , who verbally acknowledged these results. Electronically Signed   By: Burman Nieves M.D.   On: 09/23/2019 02:23   DG Chest Port 1 View  Result Date: 10/06/2019 CLINICAL DATA:  Post intubation.  Seizure.  CPR. EXAM: PORTABLE CHEST 1 VIEW COMPARISON:  01/03/2013 FINDINGS: Endotracheal tube placed with tip measuring 3.5 cm above the carina. An enteric tube is present. Tip is not definitively visualized past the EG junction. Shallow inspiration. Diffuse cardiac enlargement. Lungs are grossly clear. No pleural effusions. No pneumothorax. Visualized bones appear intact. IMPRESSION: Endotracheal tube tip measures 3.5 cm above the carina. Enteric tube tip is not definitively visualized past the EG junction. Shallow inspiration. Diffuse cardiac enlargement. Electronically Signed   By: Burman Nieves M.D.   On: 10/02/2019 01:03      Medications:     Current Medications: . heparin  5,000 Units Subcutaneous Q8H  . pantoprazole (PROTONIX) IV  40 mg Intravenous QHS  . potassium chloride  20 mEq Per Tube Q4H     Infusions: . sodium chloride    . sodium  chloride    . epinephrine Stopped (10/13/2019 0138)  . norepinephrine (LEVOPHED) Adult infusion    . potassium chloride    . vasopressin         Assessment/Plan   1.  Out-of-hospital arrest  As reported by the family patient had seizures at home and then became unresponsive.  CT of the head shows an aneurysm in the right parasellar region with extensive subarachnoid hemorrhage consistent with rupture.  At the current time it does not seem that the patient arrested due to a primary cardiac pathology.  We will defer to the neurology service for further care   Lonie Peak, MD  10/02/2019, 2:45 AM  Cardiology Overnight Team Please contact [facility] for night-coverage after hours (4p -7a ) and weekends on [email or web address]

## 2019-10-14 NOTE — Code Documentation (Signed)
Pt comes via [facility] for called for seizure, CPR started by fire, pt was in asystole, pulseless and apneic, went into afib RVR, synchronized cardioversion and shocked twice, went back into NSR, then lost pulses two more times with one round of CPR twice, total of 3 epi given

## 2019-10-14 NOTE — Progress Notes (Signed)
Stroke education deferred in light of poor prognosis. Son states he understands what the doctor talked with him about today. No questions. Chaplain services offered and declined.

## 2019-10-14 NOTE — Consult Note (Addendum)
NEUROLOGY CONSULTATION NOTE   Date of service: October 14, 2019 Patient Name: Rachel Avila MRN:  [id number] DOB:  1965-03-24 Reason for consult: "Seizure, cardiac arrest"  History of Present Illness  Rachel Avila is a 54 y.o. female with PMH significant for HTN, HLD, DM2, morbid obesity who presents with out of hospital cardiac arrest. Patient unable to provide any meaningful hx, most of the hx obtained from son and from chart review.   Son last saw patient at her baseline around dinner time when she walked in to lie on her bed. Son reports that he was woken up at 11pm by his siter who reported that patient was gurgling at her mouth and having a seizure. Son found the patient sitting up in the bed, not responsive and gurgling/foaming at the mouth. He called 911, was instructed and did CPR on patient until EMS arrived about 5-10 mins later. EMS noted patient was apneic and pulseless and continued CPR. Unclear downtime, son is not sure. Initial rhythm of pulseless electrical activity per chart review. No clear information on time to ROSC available in the chart.  She did not require any sedation or paralytic for intubation.  Labs in the ED with elevated lactate to 5.0, troponin of 48, vital with hypotension and patient on Vasopressin. 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 It also demonstrated diffuse cerebral edema.   ROS   Unable to provide 2/2 intubation and AMS.  Past History   Past Medical History:  Diagnosis Date  . Diabetes mellitus   . Hyperlipidemia   . Hypertension   . Obesity    Past Surgical History:  Procedure Laterality Date  . CESAREAN SECTION     No family history on file. Social History   Socioeconomic History  . Marital status: Single    Spouse name: Not on file  . Number of children: Not on file  . Years of education: Not on file  . Highest education level: Not on file   Occupational History  . Not on file  Tobacco Use  . Smoking status: Former Smoker    Packs/day: 0.50  Substance and Sexual Activity  . Alcohol use: No  . Drug use: Not on file  . Sexual activity: Not on file  Other Topics Concern  . Not on file  Social History Narrative  . Not on file   Social Determinants of Health   Financial Resource Strain:   . Difficulty of Paying Living Expenses: Not on file  Food Insecurity:   . Worried About Programme researcher, broadcasting/film/video in the Last Year: Not on file  . Ran Out of Food in the Last Year: Not on file  Transportation Needs:   . Lack of Transportation (Medical): Not on file  . Lack of Transportation (Non-Medical): Not on file  Physical Activity:   . Days of Exercise per Week: Not on file  . Minutes of Exercise per Session: Not on file  Stress:   . Feeling of Stress : Not on file  Social Connections:   . Frequency of Communication with Friends and Family: Not on file  . Frequency of Social Gatherings with Friends and Family: Not on file  . Attends Religious Services: Not on file  . Active Member of Clubs or Organizations: Not on file  . Attends Banker Meetings: Not on file  . Marital Status: Not on file   Allergies  Allergen Reactions  .  Bee Venom Swelling    Medications  (Not in a hospital admission)    Vitals  Pulse Rate:  [80-96] 96 (09/28 0215) Resp:  [18-25] 24 (09/28 0215) BP: (73-148)/(45-102) 98/64 (09/28 0215) SpO2:  [90 %-100 %] 94 % (09/28 0215) FiO2 (%):  [100 %] 100 % (09/28 0100)  Body mass index is 67.47 kg/m.  Physical Exam   General: Laying in bed; intubated. HENT: Normal oropharynx and mucosa. Normal external appearance of ears and nose. Neck: Supple, no pain or tenderness. CV: No JVD. No peripheral edema. Pulmonary: Symmetric Chest rise. Not breathing over the vent. Abdomen: Soft to touch, non-tender Ext: No cyanosis, edema, or deformity Skin: No rash. Normal palpation of  skin. Musculoskeletal: Normal digits and nails by inspection. No clubbing. R foot bandaged.  Neurologic Examination  Mental status/Cognition: No response to voice, nares stimulation or nailbed pressure. Brainstem reflexes: Pupils 41mm BL and non reactive to bright light. Corneals: negative BL Cough: absent Gag: absent Did not attempt occulocephalics due to her size and ETT in mouth. Tone: hypotonic. No grimace to nares stimulation or nailbed pressure in any extremities. No movement in any extremities to nailbed pressure.  Reflexes:  Right Left Comments  Pectoralis      Biceps (C5/6) 0 0   Brachioradialis (C5/6) 1 1    Triceps (C6/7) 0 0    Patellar (L3/4) 0 0    Achilles (S1) 0 0    Hoffman      Plantar - mute Held off on plantar reflex in R foot 2/2 bandage and recent injury.  Jaw jerk     Labs   Lab Results  Component Value Date   NA 145 09/30/2019   K 3.0 (L) 10/03/2019   CL 102 10/13/2019   CO2 26 10/04/2019   GLUCOSE 256 (H) 09/26/2019   BUN 12 09/26/2019   CREATININE 1.24 (H) 10/15/2019   CALCIUM 8.2 (L) 09/30/2019   ALBUMIN 3.4 (L) 10/08/2019   AST 41 10/10/2019   ALT 19 09/20/2019   ALKPHOS 74 09/22/2019   BILITOT 0.7 09/21/2019   GFRNONAA 50 (L) 10/15/2019   GFRAA 58 (L) 10/15/2019     Imaging and Diagnostic studies  CTH w/o contrast: 1. 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 2. Diffuse cerebral edema.  Impression   Rachel Avila is a 54 y.o. female  with PMH significant for HTN, HLD, DM2, morbid obesity who presents with seizure and out of hospital cardiac arrest, and aneurysmal SAH (with H&H 5, WFNS 5). Her neurologic examination is notable for absence of all brainstem reflexes and no movement in any extremities to pain. CTH with aneurysm in the R parasellar region with extensive SAH consistent with rupture and noted diffuse cerebral edema.  I think an aneurysmal SAH probably  triggered the sequence of events in her case, leading to seizures, cardiac arrest and possibly anoxic injury.  As for treatment for the [facility], she is unfortunately between a rock and a hard place. She would likely benefit from Nimodipine but she is also hypotensive and I worry that nimodipine will bring her SBP down further and worsen any anoxic injury. I think we should hold off on Nimodipine at this time and re-evaluate once BP is stable.  Recommendations  - Agree with Keppra load - will get cEEG, maintenance AED based on cEEG. - Agree with TTM. - Keep platelets above 100k, INR < 1.4. Avoid antiplatelet agents, no Heparin for DVT ppx. -  Keep SBP < 160 treat with PRN if SBP is greater than 160, avoid hypotension. - Avoid hyponatremia, recommend aggressive management of any hyperthermia and maintain Euvolemia. - Can consider Nimodipine at a later time but given hypotension at this time, this will likely worsen with Nimdipine at this time. - Repeat CTH without contrast in 6 hours.  - Obtain CT angio head and Neck with contrast. - MRI Brain without contrast when stable. - recommend routine screening of first degree relatives for cerebral aneurysms.  ______________________________________________________________________   Thank you for the opportunity to take part in the care of this patient. If you have any further questions, please contact the neurology consultation attending.  Signed,  Erick Blinks [facility] Pager Number [phone]

## 2019-10-14 NOTE — Progress Notes (Signed)
eLink Physician-Brief Progress Note Patient Name: Rachel Avila DOB: 1965/10/14 MRN: [id number]   Date of Service  2019/11/04  HPI/Events of Note  Multiple issues: 1. K+ = 3.3 and Creatinine = 1.24 and 2. Troponin = 714.  eICU Interventions  Plan: 1. Replace K+.  2. Continue to Trend Troponin.      Intervention Category Major Interventions: Electrolyte abnormality - evaluation and management;Other:  Nakaya Mishkin Dennard Nip 11/04/2019, 6:06 AM

## 2019-10-14 NOTE — Procedures (Signed)
Patient Name: Rachel Avila  MRN: [id number]  Epilepsy Attending: Charlsie Quest  Referring Physician/Provider: Dr Merrilyn Puma Date: 2019/10/29 Duration: 23.58 mins  Patient history: 54 year old female presented with seizure and out of hospital cardiac arrest as well as aneurysmal subarachnoid hemorrhage.  EEG to evaluate for seizures.  Level of alertness: Comatose  AEDs during EEG study: None  Technical aspects: This EEG study was done with scalp electrodes positioned according to the 10-20 International system of electrode placement. Electrical activity was acquired at a sampling rate of 500Hz  and reviewed with a high frequency filter of 70Hz  and a low frequency filter of 1Hz . EEG data were recorded continuously and digitally stored.   Description: EEG showed condyle generalized background suppression.  EEG was not reactive to noxious stimulation.  Hyperventilation and photic stimulation were not performed.     EKG artifact was seen throughout the study.  ABNORMALITY -Background Suppression, generalized  IMPRESSION: This study is suggestive of profound diffuse encephalopathy, nonspecific etiology but most likely secondary to anoxic/hypoxic brain injury.  No seizures or epileptiform discharges were seen throughout the recording.  Brynden Thune 

## 2019-10-14 NOTE — Op Note (Signed)
Central Venous Catheter Insertion Procedure Note  Rachel Avila  [id number]  12/22/1965  Date:10/10/2019  Time:3:21 AM   Provider Performing:Adesuwa Osgood R Karrissa Parchment   Procedure: Insertion of Non-tunneled Central Venous Catheter(36556) without US guidance  Indication(s) Medication administration  Consent Unable to obtain consent due to emergent nature of procedure.  Anesthesia Topical only with 1% lidocaine   Timeout Verified patient identification, verified procedure, site/side was marked, verified correct patient position, special equipment/implants available, medications/allergies/relevant history reviewed, required imaging and test results available.  Sterile Technique Maximal sterile technique including full sterile barrier drape, hand hygiene, sterile gown, sterile gloves, mask, hair covering, sterile ultrasound probe cover (if used).  Procedure Description Area of catheter insertion was cleaned with chlorhexidine and draped in sterile fashion.  Without real-time ultrasound guidance a central venous catheter was placed into the right subclavian vein. Nonpulsatile blood flow and easy flushing noted in all ports.  The catheter was sutured in place and sterile dressing applied.  Complications/Tolerance None; patient tolerated the procedure well. Chest X-ray is ordered to verify placement for subclavian cannulation.    EBL Minimal  Specimen(s) None

## 2019-10-14 NOTE — Progress Notes (Signed)
   11-10-2019 0033  Clinical Encounter Type  Visited With Family;Health care provider  Visit Type Code  Referral From Nurse  Consult/Referral To Chaplain  Spiritual Encounters  Spiritual Needs Prayer;Emotional  Chaplain placed patient's family in Consult A. Chaplain provided spiritual care to patient's mother, brother, and two children. Patient's brother and son visited in Trauma C until patient was moved to 2M11. Chaplain will follow up as needed

## 2019-10-14 NOTE — H&P (Signed)
NAME:  Rachel Avila, MRN:  [id number], DOB:  10-Nov-1965, LOS: 0 ADMISSION DATE:  Oct 22, 2019,  CHIEF COMPLAINT:  Cardiac arrest  Brief History   Seizure that was followed by cardiac arrest  History of present illness   54 y/o black female that presented to the ER from home.  The pt had a witnessed generalized seizure that was followed by a cardiac arrest event.  There are limited details available to the events that lead up to the cardiac arrest.  EMS found her in cardiac arrest.  They were able to obtain ROSC prior to arrival to the ER.She converted to sinus rhythm. Pt was converted from [facility] airway to ETT while in the ER.  Past Medical History  HTN DM Morbid obesity Anemia AMI  Consults:  cardiology  Procedures:  TLC  Significant Diagnostic Tests:  CT head 09/28 IMPRESSION: 1. 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 2. Diffuse cerebral edema.   Objective   Blood pressure 98/64, pulse 96, resp. rate (!) 24, height 5\' 6"  (1.676 m), SpO2 94 %.    Vent Mode: PRVC FiO2 (%):  [100 %] 100 % Set Rate:  [18 bmp-24 bmp] 24 bmp Vt Set:  [470 mL] 470 mL PEEP:  [5 cmH20-10 cmH20] 10 cmH20 Plateau Pressure:  [30 cmH20] 30 cmH20   Intake/Output Summary (Last 24 hours) at 10/22/19 0254 Last data filed at 10/22/2019 0138 Gross per 24 hour  Intake 3.44 ml  Output --  Net 3.44 ml   There were no vitals filed for this visit.  Examination: General: Orally intubated Unresponsive HENT:  Atraumatic normocephalic MMM Anicteric  Lungs: CTAB wheezing/rales/rhonchi Cardiovascular: RR no murmur Abdomen: morbid obese soft nondistended, No R/R/G but limited by neuro status. +BS Extremities: no edema  Neuro: unconscious unresponsive Not following commands  Pupils were equal at 67mm very sluggish GU: Foley cath in place    Assessment & Plan:  Cardiac Arrest SAH Right parasellar aneurysm  Morbid  obesity AKI  Plan: Admit to ICU Started TTM protocol Place TLC Pt currently does not require sedation but may once cooling has started.  Prop and fentanyl have been ordered Pt remains hypotensive.  Increase the Levophed and added vasopressin Foley cath in place.  Closely monitor I/O Standard vent protocol was initiated. Wean fio2 for sats >92% Consult Neurology and cardiology      Best practice:  Diet: NPO  Pain/Anxiety/Delirium protocol (if indicated): Prop/ fentanyl if needed VAP protocol (if indicated): started DVT prophylaxis: SCD GI prophylaxis: Protonix   Glucose control: insulin SS Mobility: bedrest Code Status:Full Family Communication: Updated the pt's son and brother at the bedside.  Explained medical dx, prognosis and plan of care Disposition: Admit to ICU  Labs   CBC: Recent Labs  Lab 2019/10/22 0052 10/22/2019 0131  WBC 9.2  --   NEUTROABS 5.3  --   HGB 10.2* 12.9  HCT 41.3 38.0  MCV 64.5*  --   PLT 333  --     Basic Metabolic Panel: Recent Labs  Lab 2019/10/22 0052 22-Oct-2019 0131  NA 140 145  K 3.5 3.0*  CL 102  --   CO2 26  --   GLUCOSE 256*  --   BUN 12  --   CREATININE 1.24*  --   CALCIUM 8.2*  --   MG 1.9  --    GFR: CrCl cannot be calculated (Unknown ideal weight.). Recent Labs  Lab 2019-10-22 0052  WBC 9.2  LATICACIDVEN 5.0*    Liver Function Tests: Recent Labs  Lab 10/13/2019 0052  AST 41  ALT 19  ALKPHOS 74  BILITOT 0.7  PROT 6.9  ALBUMIN 3.4*   No results for input(s): LIPASE, AMYLASE in the last 168 hours. No results for input(s): AMMONIA in the last 168 hours.  ABG    Component Value Date/Time   PHART 7.243 (L) 10/10/2019 0131   PCO2ART 64.3 (H) 10/15/2019 0131   PO2ART 69 (L) 10/01/2019 0131   HCO3 27.8 10/13/2019 0131   TCO2 30 10/11/2019 0131   ACIDBASEDEF 1.0 09/21/2019 0131   O2SAT 90.0 10/13/2019 0131     Coagulation Profile: No results for input(s): INR, PROTIME in the last 168 hours.  Cardiac  Enzymes: No results for input(s): CKTOTAL, CKMB, CKMBINDEX, TROPONINI in the last 168 hours.  HbA1C: Hgb A1c MFr Bld  Date/Time Value Ref Range Status  09/23/2008 10:11 AM (H) 4.6 - 6.1 % Final   6.7 (NOTE) The ADA recommends the following therapeutic goal for glycemic control related to Hgb A1c measurement: Goal of therapy: <6.5 Hgb A1c  Reference: American Diabetes Association: Clinical Practice Recommendations 2010, Diabetes Care, 2010, 33: (Suppl  1).  09/23/2008 12:00 AM 6.7 %     CBG: No results for input(s): GLUCAP in the last 168 hours.  Review of Systems:   Unable to complete due to neuro status  Past Medical History  She,  has a past medical history of Diabetes mellitus, Hyperlipidemia, Hypertension, and Obesity.   Surgical History    Past Surgical History:  Procedure Laterality Date  . CESAREAN SECTION       Social History   reports that she has quit smoking. She smoked 0.50 packs per day. She does not have any smokeless tobacco history on file. She reports that she does not drink alcohol.   Family History   Her family history is not on file.   Allergies Allergies  Allergen Reactions  . Bee Venom Swelling     Home Medications  Prior to Admission medications   Medication Sig Start Date End Date Taking? Authorizing Provider  aspirin 81 MG tablet Take 81 mg by mouth daily. Take 1 pill by mouth daily 30 minutes before you take niaspan     [provider]  Multiple Vitamin (MULTIVITAMIN WITH MINERALS) TABS tablet Take 1 tablet by mouth daily.    [provider]  Multiple Vitamins-Minerals (AIRBORNE) CHEW Chew 1 tablet by mouth daily.    [provider]     Critical care time: excluding time spent on procedures

## 2019-10-14 NOTE — Progress Notes (Signed)
CRITICAL VALUE ALERT  Critical Value:  Trop 2982  Date & Time Notied:  10/15/2019 0930  Provider Notified: CCM, DR A.  Orders Received/Actions taken: none

## 2019-10-15 ENCOUNTER — Inpatient Hospital Stay (HOSPITAL_COMMUNITY): Payer: Medicaid Other

## 2019-10-15 ENCOUNTER — Inpatient Hospital Stay (HOSPITAL_COMMUNITY): Payer: Self-pay

## 2019-10-15 DIAGNOSIS — Z7189 Other specified counseling: Secondary | ICD-10-CM

## 2019-10-15 DIAGNOSIS — I609 Nontraumatic subarachnoid hemorrhage, unspecified: Secondary | ICD-10-CM

## 2019-10-15 DIAGNOSIS — J9601 Acute respiratory failure with hypoxia: Secondary | ICD-10-CM

## 2019-10-15 DIAGNOSIS — Z515 Encounter for palliative care: Secondary | ICD-10-CM

## 2019-10-15 DIAGNOSIS — I469 Cardiac arrest, cause unspecified: Secondary | ICD-10-CM

## 2019-10-15 LAB — LACTIC ACID, PLASMA: Lactic Acid, Venous: 1.4 mmol/L (ref 0.5–1.9)

## 2019-10-15 LAB — GLUCOSE, CAPILLARY
Glucose-Capillary: 132 mg/dL — ABNORMAL HIGH (ref 70–99)
Glucose-Capillary: 144 mg/dL — ABNORMAL HIGH (ref 70–99)
Glucose-Capillary: 127 mg/dL — ABNORMAL HIGH (ref 70–99)
Glucose-Capillary: 136 mg/dL — ABNORMAL HIGH (ref 70–99)
Glucose-Capillary: 132 mg/dL — ABNORMAL HIGH (ref 70–99)
Glucose-Capillary: 113 mg/dL — ABNORMAL HIGH (ref 70–99)

## 2019-10-15 LAB — ECHOCARDIOGRAM COMPLETE
Area-P 1/2: 3.27 cm2
Height: 66 in
S' Lateral: 3.5 cm
Weight: 6483.29 oz

## 2019-10-15 MED ORDER — SODIUM CHLORIDE 0.9 % IV BOLUS: 1000.0000 mL | Freq: Once | INTRAVENOUS | Status: DC

## 2019-10-15 MED ORDER — SODIUM CHLORIDE 0.9 % IV BOLUS
1000.0000 mL | Freq: Once | INTRAVENOUS | Status: AC
  Administered 2019-10-15: 1000 mL via INTRAVENOUS

## 2019-10-15 MED ORDER — PERFLUTREN LIPID MICROSPHERE
1.0000 mL | INTRAVENOUS | Status: AC | PRN
  Administered 2019-10-15: 3 mL via INTRAVENOUS
  Filled 2019-10-15: qty 10

## 2019-10-15 NOTE — Progress Notes (Signed)
NAME:  Rachel Avila, MRN:  [id number], DOB:  [birth year], LOS: 1 ADMISSION DATE:  09/26/2019,  CHIEF COMPLAINT:  Cardiac arrest  Brief History   Seizure that was followed by cardiac arrest  History of present illness   54 y/o black female that presented to the ER from home.  The pt had a witnessed generalized seizure that was followed by a cardiac arrest event.  There are limited details available to the events that lead up to the cardiac arrest.  EMS found her in cardiac arrest.  They were able to obtain ROSC prior to arrival to the ER.She converted to sinus rhythm. Pt was converted from [facility] airway to ETT while in the ER.  Past Medical History  HTN DM Morbid obesity Anemia AMI  Consults:  Neurology cardiology  Procedures:  TLC  Significant Diagnostic Tests:  CT head 09/28 IMPRESSION: 1. 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 2. Diffuse cerebral edema.  ECHO 9/29 Normal LV function, EF 60-65%, moderate LVH. Grade II diastolic dysfunction. Moderate TR.   Interim:  Patient still unresponsive on mechanical ventilation, however is able to initiate spontaneous respirations.  ECHO showing normal LV function with EF 60-65%, G2DD, moderate TR.  CT head ordered to reassess SAH.  EEG unremarkable for seizure activity.   Objective   Blood pressure (!) 91/59, pulse 79, temperature 98.1 F (36.7 C), temperature source Bladder, resp. rate 16, height 5\' 6"  (1.676 m), weight (!) 183.8 kg, SpO2 90 %.    Vent Mode: PSV;CPAP FiO2 (%):  [80 %-100 %] 80 % Set Rate:  [24 bmp] 24 bmp Vt Set:  [470 mL] 470 mL PEEP:  [8 cmH20] 8 cmH20 Pressure Support:  [28 cmH20] 28 cmH20 Plateau Pressure:  [29 cmH20] 29 cmH20   Intake/Output Summary (Last 24 hours) at 10/15/2019 1332 Last data filed at 10/15/2019 1100 Gross per 24 hour  Intake 1604.78 ml  Output 1635 ml  Net -30.22 ml   Filed Weights   10/15/19 0500  Weight:  (!) 183.8 kg    Examination: General: critically ill middle-aged female, unresponsive on mechanical ventilation. HENT: ETT in place, no cough or gag. Eyes: pupils dilated, unresponsive to light bilaterally, no oculocephalic reflex, no doll's eyes. CV: normal rate and regular rhythm, no m/r/g. Lungs: mechanically ventilated breath sounds. Abdomen: morbidly obese. Soft, non-distended, +BS. Extremities: no edema Neuro: GCS 3, unresponsive, not on sedation. No response to deep pain centrally or peripherally.   Assessment & Plan:  Cardiac Arrest Catastrophic SAH, [place] 5 Right parasellar aneurysm  Morbid obesity AKI  Very grim prognosis unfortunately. Will likely progress to brain death. Showing signs of hemodynamic instability due to progressive pituitary and autonomic loss. She is maintaining respirations at this time, which precludes a diagnosis of death by neurological criteria at this time.  Patient is FULL code at this time. Will need to continue goals of care discussions with family.   Plan: -On levophed and vasopressin -continue vent management -Wean fio2 for sats >92% -Neurology following -No meaningful interventions that can be offered at this time -EEG unremarkable for seizure activity -f/u CT head -Continue goals of care conversations with family -Hold anticoagulation in the setting of active SAH   Best practice:  Diet: NPO  Pain/Anxiety/Delirium protocol (if indicated): Prop/ fentanyl if needed VAP protocol (if indicated): started DVT prophylaxis: SCD GI prophylaxis: Protonix   Glucose control: insulin SS Mobility: bedrest Code Status:Full Family Communication: Updated the pt's son, daughter,  mother, and brother at bedside.  Explained medical dx, prognosis and plan of care. Disposition: Admit to ICU  Labs   CBC: Recent Labs  Lab 10/09/2019 0052 09/17/2019 0131 09/28/2019 0337 09/24/2019 0445  WBC 9.2  --  12.8*  --   NEUTROABS 5.3  --   --   --   HGB  10.2* 12.9 9.3* 12.2  HCT 41.3 38.0 36.1 36.0  MCV 64.5*  --  63.6*  --   PLT 333  --  307  --     Basic Metabolic Panel: Recent Labs  Lab 09/23/2019 0052 10/06/2019 0131 10/15/2019 0337 10/13/2019 0445 09/21/2019 0806  NA 140 145 142 144 141  K 3.5 3.0* 3.4* 3.3* 3.6  CL 102  --  104  --  104  CO2 26  --  28  --  26  GLUCOSE 256*  --  170*  --  134*  BUN 12  --  14  --  15  CREATININE 1.24*  --  1.24*  --  1.01*  CALCIUM 8.2*  --  8.3*  --  8.4*  MG 1.9  --  1.8  --   --   PHOS  --   --  5.1*  --   --    GFR: Estimated Creatinine Clearance: 112.2 mL/min (A) (by C-G formula based on SCr of 1.01 mg/dL (H)). Recent Labs  Lab 10/02/2019 0052 10/01/2019 0245 09/23/2019 0337 10/15/19 1042  WBC 9.2  --  12.8*  --   LATICACIDVEN 5.0* 2.1*  --  1.4    Liver Function Tests: Recent Labs  Lab 09/26/2019 0052  AST 41  ALT 19  ALKPHOS 74  BILITOT 0.7  PROT 6.9  ALBUMIN 3.4*   No results for input(s): LIPASE, AMYLASE in the last 168 hours. No results for input(s): AMMONIA in the last 168 hours.  ABG    Component Value Date/Time   PHART 7.422 09/24/2019 0445   PCO2ART 44.6 10/05/2019 0445   PO2ART 108 09/18/2019 0445   HCO3 29.1 (H) 09/26/2019 0445   TCO2 30 09/28/2019 0445   ACIDBASEDEF 1.0 09/21/2019 0131   O2SAT 98.0 09/25/2019 0445     Coagulation Profile: Recent Labs  Lab 09/19/2019 0337  INR 1.1    Cardiac Enzymes: No results for input(s): CKTOTAL, CKMB, CKMBINDEX, TROPONINI in the last 168 hours.  HbA1C: Hgb A1c MFr Bld  Date/Time Value Ref Range Status  09/23/2008 10:11 AM (H) 4.6 - 6.1 % Final   6.7 (NOTE) The ADA recommends the following therapeutic goal for glycemic control related to Hgb A1c measurement: Goal of therapy: <6.5 Hgb A1c  Reference: American Diabetes Association: Clinical Practice Recommendations 2010, Diabetes Care, 2010, 33: (Suppl  1).  09/23/2008 12:00 AM 6.7 %     CBG: Recent Labs  Lab 10/07/2019 2014 09/20/2019 2345 10/15/19 0355  10/15/19 0813 10/15/19 1207  GLUCAP 118* 125* 132* 144* 127*    Review of Systems:   Unable to complete due to neuro status  Past Medical History  She,  has a past medical history of Diabetes mellitus, Hyperlipidemia, Hypertension, and Obesity.   Surgical History    Past Surgical History:  Procedure Laterality Date   CESAREAN SECTION       Social History   reports that she has quit smoking. She smoked 0.50 packs per day. She does not have any smokeless tobacco history on file. She reports that she does not drink alcohol.   Family History   Her family  history is not on file.   Allergies Allergies  Allergen Reactions   Bee Venom Swelling     Home Medications  Prior to Admission medications   Medication Sig Start Date End Date Taking? Authorizing Provider  aspirin 81 MG tablet Take 81 mg by mouth daily. Take 1 pill by mouth daily 30 minutes before you take niaspan     [provider]  Multiple Vitamin (MULTIVITAMIN WITH MINERALS) TABS tablet Take 1 tablet by mouth daily.    [provider]  Multiple Vitamins-Minerals (AIRBORNE) CHEW Chew 1 tablet by mouth daily.    [provider]     Critical care time: 40 minutes     Merrilyn Puma, MD 10/15/2019, 1:32 PM

## 2019-10-15 NOTE — Progress Notes (Signed)
Patient transported to CT without issues. Upon entering room, SAT decreased to 84%. Left on 100% and suctioned for moderate amount thick tan secretions. RT to monitor as needed

## 2019-10-15 NOTE — Consult Note (Signed)
Patient with catastrophic aneurysmal brain bleed. Provided support to family at bedside. Anticipate hospital death within hours-will likely meet brain death criteria.  Anderson Malta, DO Palliative Medicine

## 2019-10-15 NOTE — Progress Notes (Signed)
Echocardiogram 2D Echocardiogram has been performed.  Rachel Avila 10/15/2019, 12:05 PM   Attempted to flush left hand IV to administer definity; IV was infiltrated. I removed the IV and documented removal.

## 2019-10-15 NOTE — Consult Note (Signed)
Consultation Note Date: 10/15/2019   Patient Name: Rachel Avila  DOB: 12-Feb-1965  MRN: [id number]  Age / Sex: 54 y.o., female  PCP: Patient, No Pcp Per Referring Physician: Kipp Brood, MD  Reason for Consultation: Establishing goals of care  HPI/Patient Profile: 54 y.o. female  with past medical history of obesity, HTN, HLD, DM admitted on 09/24/2019 with witnessed generalized seizure followed by cardiac arrest. Limited details of events leading up to cardiac arrest. EMS obtained ROSC prior to ER arrival. CT revealed catastrophic SAH. Patient unresponsive to all stimulation. Intermittent respiratory effort otherwise no brainstem reflexes. EEG revealed profound diffuse encephalopathy and anoxic brain injury. Critical care following. Prognosis is dismal and it is likely patient will progress to full brain death. Palliative medicine consultation for goals of care/terminal care.   Clinical Assessment and Goals of Care:  I have reviewed medical records, discussed with care team, and met with patient's family at bedside. Patient is unresponsive on ventilator. She is 100% FIO2 on two pressors.   Patient's son ([place]), mother Deneise Lever), and brother at bedside.   I introduced Palliative Medicine as specialized medical care for people living with serious illness. It focuses on providing relief from the symptoms and stress of a serious illness.  Mother shares that prior to admission, Alaiza "Joseph Art" was independent. She just bought a new car on Monday prior to events. Not married. Two children, son ([place]) and daughter who reportedly is 54yo and graduating high school soon. Deneise Lever reports that [place] had episodes of vomiting a few days leading up to event. Deneise Lever urged her daughter to go to the doctor but unfortunately Roshawnda did not because she is uninsured.   Discussed events leading up to admission and course of  hospitalization including diagnoses and interventions. Family very tearful throughout visit and devastated by the thought of losing [place]. They have spoken with providers regarding her condition and prognosis but struggle with decisions moving forward.   Very spiritual family. Throughout my visit, they tell Aaniyah to keep fighting and not to give up. Mother tells her to wake up so she can get home and drive her new car. Mother shares that Marketa has many people praying for her including a prayer circle tonight from 7p-8p.  Compassionately broached the topic of code status with family, explaining futility of putting her through cpr/shock again if her heart stopped and she died. Again, family especially son is very tearful. They share they do not wish for her to "suffer" and seem to understand performing cpr again would not change the outcome with already devastating neurological status and prognosis. Family changes topic multiple times during code status discussion. Emotional support provided. Will continue discussions.   Questions and concerns were addressed. Therapeutic listening and emotional/spiritual support provided. PMT contact information given and reassured of ongoing palliative support with these tough decisions.     SUMMARY OF RECOMMENDATIONS    Ongoing palliative discussions.  Spoke with patient's son, mother, and brother at bedside. Family does seem to understand diagnoses and grim prognosis  but struggle with decision making following this devastating event, as the patient was independent prior to admission.   PMT will continue to follow and support family.   Spiritual care consult for ongoing support. Very spiritual family.     Code Status/Advance Care Planning:  Full code: family struggling with code status change  Symptom Management:   Per attending  Palliative Prophylaxis:   Aspiration, Delirium Protocol, Frequent Pain Assessment, Oral Care and Turn  Reposition  Psycho-social/Spiritual:   Desire for further Chaplaincy support: yes  Additional Recommendations: Caregiving  Support/Resources and Compassionate Wean Education  Prognosis:   Poor prognosis  Discharge Planning: To Be Determined      Primary Diagnoses: Present on Admission: . Cardiac arrest ([place])   I have reviewed the medical record, interviewed the patient and family, and examined the patient. The following aspects are pertinent.  Past Medical History:  Diagnosis Date  . Diabetes mellitus   . Hyperlipidemia   . Hypertension   . Obesity    Social History   Socioeconomic History  . Marital status: Single    Spouse name: Not on file  . Number of children: Not on file  . Years of education: Not on file  . Highest education level: Not on file  Occupational History  . Not on file  Tobacco Use  . Smoking status: Former Smoker    Packs/day: 0.50  Substance and Sexual Activity  . Alcohol use: No  . Drug use: Not on file  . Sexual activity: Not on file  Other Topics Concern  . Not on file  Social History Narrative  . Not on file   Social Determinants of Health   Financial Resource Strain:   . Difficulty of Paying Living Expenses: Not on file  Food Insecurity:   . Worried About Charity fundraiser in the Last Year: Not on file  . Ran Out of Food in the Last Year: Not on file  Transportation Needs:   . Lack of Transportation (Medical): Not on file  . Lack of Transportation (Non-Medical): Not on file  Physical Activity:   . Days of Exercise per Week: Not on file  . Minutes of Exercise per Session: Not on file  Stress:   . Feeling of Stress : Not on file  Social Connections:   . Frequency of Communication with Friends and Family: Not on file  . Frequency of Social Gatherings with Friends and Family: Not on file  . Attends Religious Services: Not on file  . Active Member of Clubs or Organizations: Not on file  . Attends Archivist  Meetings: Not on file  . Marital Status: Not on file   No family history on file. Scheduled Meds: . chlorhexidine gluconate (MEDLINE KIT)  15 mL Mouth Rinse BID  . Chlorhexidine Gluconate Cloth  6 each Topical Daily  . mouth rinse  15 mL Mouth Rinse 10 times per day  . pantoprazole (PROTONIX) IV  40 mg Intravenous QHS   Continuous Infusions: . sodium chloride    . sodium chloride 10 mL/hr at 10/15/19 1600  . epinephrine Stopped (09/28/2019 0138)  . norepinephrine (LEVOPHED) Adult infusion 6 mcg/min (10/15/19 1530)  . vasopressin 0.03 Units/min (10/15/19 1300)   PRN Meds:.docusate sodium, ondansetron (ZOFRAN) IV, polyethylene glycol Medications Prior to Admission:  Prior to Admission medications   Medication Sig Start Date End Date Taking? Authorizing Provider  aspirin 81 MG chewable tablet Chew 81 mg by mouth daily as needed for pain.  Yes [provider]  predniSONE (DELTASONE) 20 MG tablet Take 20 mg by mouth 2 (two) times daily. 10/07/19  Yes [provider]   Allergies  Allergen Reactions  . Bee Venom Swelling   Review of Systems  Unable to perform ROS: Acuity of condition   Physical Exam Vitals and nursing note reviewed.  Constitutional:      Interventions: She is intubated.     Comments: unresponsive  Cardiovascular:     Rate and Rhythm: Normal rate.  Pulmonary:     Effort: No tachypnea, accessory muscle usage or respiratory distress. She is intubated.     Breath sounds: Decreased breath sounds present.  Skin:    General: Skin is warm and dry.  Neurological:     GCS: GCS eye subscore is 1. GCS verbal subscore is 1. GCS motor subscore is 1.     Comments: Unresponsive. Pupils unresponsive. No cough/gag. No response to pain/stimuli     Vital Signs: BP (!) 85/54   Pulse 83   Temp 98.6 F (37 C)   Resp 17   Ht $R'5\' 6"'bL$  (1.676 m)   Wt (!) 183.8 kg   SpO2 (!) 89%   BMI 65.40 kg/m  Pain Scale: CPOT       SpO2: SpO2: (!) 89 % O2 Device:SpO2:  (!) 89 % O2 Flow Rate: .   IO: Intake/output summary:   Intake/Output Summary (Last 24 hours) at 10/15/2019 1645 Last data filed at 10/15/2019 1600 Gross per 24 hour  Intake 2466.21 ml  Output 675 ml  Net 1791.21 ml    LBM: Last BM Date:  (PTA) Baseline Weight: Weight: (!) 183.8 kg Most recent weight: Weight: (!) 183.8 kg     Palliative Assessment/Data: PPS 10%     Time In: 1600 Time Out: 1700 Time Total: 75min Greater than 50%  of this time was spent counseling and coordinating care related to the above assessment and plan.  Signed by:  Ihor Dow, DNP, FNP-C Palliative Medicine Team  Phone: [phone] Fax: [phone]   Please contact Palliative Medicine Team phone at [phone] for questions and concerns.  For individual provider: See Shea Evans

## 2019-10-15 NOTE — Progress Notes (Signed)
eLink Physician-Brief Progress Note Patient Name: Rachel Avila DOB: 1965/11/22 MRN: [id number]   Date of Service  10/15/2019  HPI/Events of Note  Oliguria - Urine output 55 mL overnight. Bladder scan = 0 residual. CVP = 10.   eICU Interventions  Plan: 1. Bolus with 0.9 NaCl 1 liter IV over 1 hour now.      Intervention Category Major Interventions: Other:  Anaiah Mcmannis Dennard Nip 10/15/2019, 6:10 AM

## 2019-10-16 ENCOUNTER — Inpatient Hospital Stay (HOSPITAL_COMMUNITY): Payer: Medicaid Other

## 2019-10-16 DIAGNOSIS — J9601 Acute respiratory failure with hypoxia: Secondary | ICD-10-CM

## 2019-10-16 DIAGNOSIS — I609 Nontraumatic subarachnoid hemorrhage, unspecified: Secondary | ICD-10-CM

## 2019-10-16 DIAGNOSIS — Z7189 Other specified counseling: Secondary | ICD-10-CM

## 2019-10-16 DIAGNOSIS — Z515 Encounter for palliative care: Secondary | ICD-10-CM

## 2019-10-16 LAB — GLUCOSE, CAPILLARY
Glucose-Capillary: 121 mg/dL — ABNORMAL HIGH (ref 70–99)
Glucose-Capillary: 126 mg/dL — ABNORMAL HIGH (ref 70–99)

## 2019-10-16 LAB — POCT I-STAT 7, (LYTES, BLD GAS, ICA,H+H)
Acid-Base Excess: 2 mmol/L (ref 0.0–2.0)
Bicarbonate: 28.7 mmol/L — ABNORMAL HIGH (ref 20.0–28.0)
Calcium, Ion: 1.19 mmol/L (ref 1.15–1.40)
HCT: 34 % — ABNORMAL LOW (ref 36.0–46.0)
Hemoglobin: 11.6 g/dL — ABNORMAL LOW (ref 12.0–15.0)
O2 Saturation: 79 %
Patient temperature: 98.6
Potassium: 3.9 mmol/L (ref 3.5–5.1)
Sodium: 147 mmol/L — ABNORMAL HIGH (ref 135–145)
TCO2: 30 mmol/L (ref 22–32)
pCO2 arterial: 53.2 mmHg — ABNORMAL HIGH (ref 32.0–48.0)
pH, Arterial: 7.34 — ABNORMAL LOW (ref 7.350–7.450)
pO2, Arterial: 47 mmHg — ABNORMAL LOW (ref 83.0–108.0)

## 2019-10-16 MED ORDER — BUMETANIDE 0.25 MG/ML IJ SOLN
1.0000 mg | Freq: Once | INTRAMUSCULAR | Status: AC
  Administered 2019-10-16: 1 mg via INTRAVENOUS
  Filled 2019-10-16 (×2): qty 4

## 2019-10-16 MED ORDER — FUROSEMIDE 10 MG/ML IJ SOLN
40.0000 mg | Freq: Once | INTRAMUSCULAR | Status: AC
  Administered 2019-10-16: 40 mg via INTRAVENOUS

## 2019-10-16 MED ORDER — FUROSEMIDE 10 MG/ML IJ SOLN
INTRAMUSCULAR | Status: AC
  Filled 2019-10-16: qty 4

## 2019-10-16 MED ORDER — GLYCOPYRROLATE 0.2 MG/ML IJ SOLN
0.3000 mg | INTRAMUSCULAR | Status: AC
  Administered 2019-10-16: 0.3 mg via INTRAVENOUS
  Filled 2019-10-16: qty 2

## 2019-10-16 MED ORDER — MORPHINE SULFATE (PF) 2 MG/ML IV SOLN
2.0000 mg | INTRAVENOUS | Status: DC | PRN
  Administered 2019-10-16: 2 mg via INTRAVENOUS
  Filled 2019-10-16: qty 1

## 2019-10-16 MED ORDER — LORAZEPAM 2 MG/ML IJ SOLN: 0.5000 mg | INTRAMUSCULAR | Status: DC | PRN

## 2019-10-16 MED ORDER — GLYCOPYRROLATE 0.2 MG/ML IJ SOLN: 0.2000 mg | INTRAMUSCULAR | Status: DC | PRN

## 2019-10-17 NOTE — Progress Notes (Signed)
eLink Physician-Brief Progress Note Patient Name: VERLISA VARA DOB: 12/11/1965 MRN: [id number]   Date of Service  09/23/2019  HPI/Events of Note  Patient desaturated again with position change, CXR consistent with pulmonary edema.  eICU Interventions  Bumex 1 mg iv now. PEEP increased to 17        Latia Mataya U Morgyn Marut 10/07/2019, 6:48 AM

## 2019-10-17 NOTE — Progress Notes (Signed)
Daily Progress Note   Patient Name: Rachel Avila       Date: Oct 20, 2019 DOB: 11/24/1965  Age: 54 y.o. MRN#: [id number] Attending Physician: Kipp Brood, MD Primary Care Physician: Patient, No Pcp Per Admit Date: 09/28/2019  Reason for Consultation/Follow-up: Establishing goals of care and Terminal Care  Subjective: Patient remains unresponsive on ventilator.   GOC:  Discussed with Dr. Lynetta Mare and RN. Family understands devastating prognosis and are ready for transition to comfort measures with compassionate extubation this afternoon.   Visited with family at bedside. Son, Rachel Avila present along with other family members. Rachel Avila is speaking with another staff member and does not current have questions or needs from PMT provider. PMT contact information given to family member at bedside and encouraged them to call me with questions or concerns this afternoon.   Length of Stay: 2  Current Medications: Scheduled Meds:  . chlorhexidine gluconate (MEDLINE KIT)  15 mL Mouth Rinse BID  . Chlorhexidine Gluconate Cloth  6 each Topical Daily  . mouth rinse  15 mL Mouth Rinse 10 times per day  . pantoprazole (PROTONIX) IV  40 mg Intravenous QHS    Continuous Infusions: . sodium chloride    . sodium chloride 10 mL/hr at 10/20/19 0600  . epinephrine Stopped (10/12/2019 0138)  . norepinephrine (LEVOPHED) Adult infusion 45 mcg/min (2019-10-20 0947)  . vasopressin 0.03 Units/min (Oct 20, 2019 0600)    PRN Meds: docusate sodium, ondansetron (ZOFRAN) IV, polyethylene glycol  Physical Exam Vitals and nursing note reviewed.  Constitutional:      Interventions: She is intubated.  Cardiovascular:     Rate and Rhythm: Normal rate.  Pulmonary:     Effort: No tachypnea, accessory muscle usage or  respiratory distress. She is intubated.  Neurological:     Comments: Unresponsive to all stimuli. No cough/gag. Unresponsive, fixed pupils. No brainstem reflexes besides intermittent respiratory effort.             Vital Signs: BP 94/75   Pulse 99   Temp 99.5 F (37.5 C)   Resp (!) 24   Ht _0  (1.676 m)   Wt (!) 183.8 kg   SpO2 (!) 82%   BMI 65.40 kg/m  SpO2: SpO2: (!) 82 % O2 Device: O2 Device: Ventilator O2 Flow Rate:    Intake/output summary:   Intake/Output  Summary (Last 24 hours) at 11/07/2019 1018 Last data filed at 2019/11/07 0600 Gross per 24 hour  Intake 1841.93 ml  Output 650 ml  Net 1191.93 ml   LBM: Last BM Date:  (PTA) Baseline Weight: Weight: (!) 183.8 kg Most recent weight: Weight: (!) 183.8 kg       Palliative Assessment/Data: PPS 10%     Patient Active Problem List   Diagnosis Date Noted  . Subarachnoid hemorrhage ([place])   . Acute respiratory failure with hypoxia ([place])   . Palliative care by specialist   . Goals of care, counseling/discussion   . Cardiac arrest ([place]) 10/09/2019  . Diabetes mellitus   . DM 11/02/2008  . OBESITY, MORBID 11/02/2008  . ANEMIA 11/02/2008  . AMI 11/02/2008  . ELEVATED BLOOD PRESSURE WITHOUT DIAGNOSIS OF HYPERTENSION 11/02/2008    Palliative Care Assessment & Plan   Patient Profile: 54 y.o. female  with past medical history of obesity, HTN, HLD, DM admitted on 10/13/2019 with witnessed generalized seizure followed by cardiac arrest. Limited details of events leading up to cardiac arrest. EMS obtained ROSC prior to ER arrival. CT revealed catastrophic SAH. Patient unresponsive to all stimulation. Intermittent respiratory effort otherwise no brainstem reflexes. EEG revealed profound diffuse encephalopathy and anoxic brain injury. Critical care following. Prognosis is dismal and it is likely patient will progress to full brain death. Palliative medicine consultation for goals of care/terminal care.    Assessment: Subarachnoid hemorrhage with coma Cerebral edema and herniation S/p cardiac arrest Morbid obesity AKI  Recommendations/Plan:  After further [place] discussions with family, plan is for compassionate extubation and transition to comfort measures only.   DNR now.  Comfort meds on MAR.  Unrestricted visitor access.  Anticipate hospital death within minutes-hours of extubation.   Spiritual care consult for EOL support.   Goals of Care and Additional Recommendations:  Limitations on Scope of Treatment: Full Comfort Care  Code Status: DNR/DNI   Code Status Orders  (From admission, onward)         Start     Ordered   2019/11/07 0426  Do not attempt resuscitation (DNR)  Continuous       Question Answer Comment  In the event of cardiac or respiratory ARREST Do not call a "code blue"   In the event of cardiac or respiratory ARREST Do not perform Intubation, CPR, defibrillation or ACLS   In the event of cardiac or respiratory ARREST Use medication by any route, position, wound care, and other measures to relive pain and suffering. May use oxygen, suction and manual treatment of airway obstruction as needed for comfort.      2019/11/07 0425        Code Status History    Date Active Date Inactive Code Status Order ID Comments User Context   09/29/2019 0157 07-Nov-2019 0425 Full Code [id number]  Deland Pretty, MD ED   09/30/2019 0154 10/15/2019 0157 Full Code [id number]  Deland Pretty, MD ED   Advance Care Planning Activity       Prognosis:   Minutes-hours once extubated  Discharge Planning:  Anticipated Hospital Death  Care plan was discussed with RN, Dr Lynetta Mare, family at bedside including son Rachel Avila  Thank you for allowing the Palliative Medicine Team to assist in the care of this patient.   Total Time 20 Prolonged Time Billed  no      Greater than 50%  of this time was spent counseling and coordinating care related to the above assessment  and  plan.  Ihor Dow, DNP, FNP-C Palliative Medicine Team  Phone: [phone] Fax: [phone]  Please contact Palliative Medicine Team phone at [phone] for questions and concerns.

## 2019-10-17 NOTE — Progress Notes (Signed)
Pt to comfort care per physician order/ family request. Prior to extubation pt expired. Ventilator turned off at 1220. Pt family remains at bedside at this time. RT will be available as needed.

## 2019-10-17 NOTE — Progress Notes (Signed)
NAME:  Rachel Avila, MRN:  [id number], DOB:  02-19-1965, LOS: 2 ADMISSION DATE:  10/04/2019,  CHIEF COMPLAINT:  Cardiac arrest  Brief History   Seizure that was followed by cardiac arrest  History of present illness   54 y/o black female that presented to the ER from home.  The pt had a witnessed generalized seizure that was followed by a cardiac arrest event.  There are limited details available to the events that lead up to the cardiac arrest.  EMS found her in cardiac arrest.  They were able to obtain ROSC prior to arrival to the ER.She converted to sinus rhythm. Pt was converted from [facility] airway to ETT while in the ER.  Past Medical History  HTN DM Morbid obesity Anemia AMI  Consults:  Neurology cardiology  Procedures:  TLC  Significant Diagnostic Tests:  CT head 09/28 IMPRESSION: 1. 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 2. Diffuse cerebral edema.  ECHO 9/29 Normal LV function, EF 60-65%, moderate LVH. Grade II diastolic dysfunction. Moderate TR.   Interim:  Patient remains unresponsive.  Family (patient's son is MDM) has made patient DNR. After discussion, they have transitioned patient to comfort care. Will extubate today. PRN morphine for comfort measures. Palliative following.   Objective   Blood pressure 94/75, pulse (!) 103, temperature 99.5 F (37.5 C), resp. rate (!) 24, height 5\' 6"  (1.676 m), weight (!) 183.8 kg, SpO2 (!) 77 %. CVP:  [10 mmHg-26 mmHg] 10 mmHg  Vent Mode: PRVC FiO2 (%):  [100 %] 100 % Set Rate:  [24 bmp] 24 bmp Vt Set:  [470 mL] 470 mL PEEP:  [8 cmH20-17 cmH20] 17 cmH20 Plateau Pressure:  [26 cmH20-33 cmH20] 33 cmH20   Intake/Output Summary (Last 24 hours) at 2019-11-09 1204 Last data filed at 11-09-19 0600 Gross per 24 hour  Intake 794.96 ml  Output 650 ml  Net 144.96 ml   Filed Weights   10/15/19 0500  Weight: (!) 183.8 kg    Examination: General:  critically ill middle-aged female, unresponsive on mechanical ventilation. HENT: ETT in place, no cough or gag. Eyes: pupils dilated, unresponsive to light bilaterally, no oculocephalic reflex. CV: normal rate and regular rhythm, no m/r/g Pulm: mechanically ventilated breath sounds Abdomen: morbidly obese. Soft, non-distended, +BS Extremities: no edema Neuro: GCS 3, unresponvie, not on sedation. No response to central or peripheral noxious stimuli.    Assessment & Plan:  Cardiac Arrest Catastrophic SAH, [place] 5 Right parasellar aneurysm  Morbid obesity AKI  Patient's family have changed her code status to DNR. Patient's son, 10/17/19, is the medical decision maker at this time. After discussion with the family, they have decided to pursue full comfort care. Will extubate patient. PRN morphine ordered for comfort measures.   Best practice:  Diet: NPO  Pain/Anxiety/Delirium protocol (if indicated): Prop/ fentanyl if needed VAP protocol (if indicated): started DVT prophylaxis: SCD GI prophylaxis: Protonix   Glucose control: insulin SS Mobility: bedrest Code Status: DNR Family Communication: Discussed with family at bedside today. Disposition: Comfort care  Labs   CBC: Recent Labs  Lab 09/24/2019 0052 10/07/2019 0131 10/10/2019 0337 10/13/2019 0445 Nov 09, 2019 0426  WBC 9.2  --  12.8*  --   --   NEUTROABS 5.3  --   --   --   --   HGB 10.2* 12.9 9.3* 12.2 11.6*  HCT 41.3 38.0 36.1 36.0 34.0*  MCV 64.5*  --  63.6*  --   --  PLT 333  --  307  --   --     Basic Metabolic Panel: Recent Labs  Lab 2019-11-11 0052 11/11/2019 0052 11/11/2019 0131 11-Nov-2019 0337 11/11/19 0445 11/11/19 0806 10/15/2019 0426  NA 140   < > 145 142 144 141 147*  K 3.5   < > 3.0* 3.4* 3.3* 3.6 3.9  CL 102  --   --  104  --  104  --   CO2 26  --   --  28  --  26  --   GLUCOSE 256*  --   --  170*  --  134*  --   BUN 12  --   --  14  --  15  --   CREATININE 1.24*  --   --  1.24*  --  1.01*  --   CALCIUM  8.2*  --   --  8.3*  --  8.4*  --   MG 1.9  --   --  1.8  --   --   --   PHOS  --   --   --  5.1*  --   --   --    < > = values in this interval not displayed.   GFR: Estimated Creatinine Clearance: 112.2 mL/min (A) (by C-G formula based on SCr of 1.01 mg/dL (H)). Recent Labs  Lab 11/11/19 0052 11-Nov-2019 0245 2019/11/11 0337 10/15/19 1042  WBC 9.2  --  12.8*  --   LATICACIDVEN 5.0* 2.1*  --  1.4    Liver Function Tests: Recent Labs  Lab 11-11-2019 0052  AST 41  ALT 19  ALKPHOS 74  BILITOT 0.7  PROT 6.9  ALBUMIN 3.4*   No results for input(s): LIPASE, AMYLASE in the last 168 hours. No results for input(s): AMMONIA in the last 168 hours.  ABG    Component Value Date/Time   PHART 7.340 (L) 10/04/2019 0426   PCO2ART 53.2 (H) 09/29/2019 0426   PO2ART 47 (L) 09/23/2019 0426   HCO3 28.7 (H) 09/22/2019 0426   TCO2 30 10/15/2019 0426   ACIDBASEDEF 1.0 11/11/19 0131   O2SAT 79.0 10/11/2019 0426     Coagulation Profile: Recent Labs  Lab 11-11-2019 0337  INR 1.1    Cardiac Enzymes: No results for input(s): CKTOTAL, CKMB, CKMBINDEX, TROPONINI in the last 168 hours.  HbA1C: Hgb A1c MFr Bld  Date/Time Value Ref Range Status  09/23/2008 10:11 AM (H) 4.6 - 6.1 % Final   6.7 (NOTE) The ADA recommends the following therapeutic goal for glycemic control related to Hgb A1c measurement: Goal of therapy: <6.5 Hgb A1c  Reference: American Diabetes Association: Clinical Practice Recommendations 2010, Diabetes Care, 2010, 33: (Suppl  1).  09/23/2008 12:00 AM 6.7 %     CBG: Recent Labs  Lab 10/15/19 1812 10/15/19 2004 10/15/19 2326 09/22/2019 0334 10/02/2019 0838  GLUCAP 136* 132* 113* 121* 126*    Review of Systems:   Unable to complete due to neuro status  Past Medical History  She,  has a past medical history of Diabetes mellitus, Hyperlipidemia, Hypertension, and Obesity.   Surgical History    Past Surgical History:  Procedure Laterality Date  . CESAREAN SECTION         Social History   reports that she has quit smoking. She smoked 0.50 packs per day. She does not have any smokeless tobacco history on file. She reports that she does not drink alcohol.   Family History   Her family  history is not on file.   Allergies Allergies  Allergen Reactions  . Bee Venom Swelling     Home Medications  Prior to Admission medications   Medication Sig Start Date End Date Taking? Authorizing Provider  aspirin 81 MG tablet Take 81 mg by mouth daily. Take 1 pill by mouth daily 30 minutes before you take niaspan     [provider]  Multiple Vitamin (MULTIVITAMIN WITH MINERALS) TABS tablet Take 1 tablet by mouth daily.    [provider]  Multiple Vitamins-Minerals (AIRBORNE) CHEW Chew 1 tablet by mouth daily.    [provider]     Critical care time: 30 minutes     Merrilyn Puma, MD 09/22/2019, 12:04 PM

## 2019-10-17 NOTE — Progress Notes (Signed)
2 RNs with 1 NT gave pt a bath and pt's O2 Sats decreased to 74% and increased to 80% upon repositioning. RT entered room and started to bag pt. Pt sats returned to 98%. Pt was then put back on the ventilator. When put back on the ventilator pt's sats decreased into 75-78%. MD was notified and orders were received to do a chest xray, lasix and ABG.  PT's son gave the doctor the permission to make his mother a DNR.

## 2019-10-17 NOTE — Progress Notes (Signed)
This chaplain phoned the unit for an update, in response to PMT spiritual care consult.  The chaplain learned the Pt. died 05/04/18 and the family left at 43.

## 2019-10-17 NOTE — Progress Notes (Signed)
   09/19/2019 0930  Clinical Encounter Type  Visited With Patient and family together  Visit Type Patient actively dying  Referral From Nurse  Consult/Referral To Chaplain  Offered comfort and prayer to patient's family and friends during end of life care. This note was prepared by Deneen Harts, M.Div..  For questions please contact by phone [phone].

## 2019-10-17 NOTE — Progress Notes (Signed)
eLink Physician-Brief Progress Note Patient Name: Rachel Avila DOB: Oct 22, 1965 MRN: [id number]   Date of Service  2019-10-21  HPI/Events of Note  Patient  Desaturated down to 80 % but dose not appear dyssynchronous on the ventilator.  eICU Interventions  Stat portable CXR, ABG, Lasix 40 mg iv x 1, following a discussion with the son [place] , patient was made DNR at his request.        Migdalia Dk 10/21/2019, 4:26 AM

## 2019-10-17 DEATH — deceased

## 2019-11-17 NOTE — Death Summary Note (Signed)
DEATH SUMMARY   Patient Details  Name: Rachel Avila MRN: [id number] DOB: Sep 30, 1965  Admission/Discharge Information   Admit Date:  10-26-2019  Date of Death: Date of Death: 10-28-19  Time of Death: Time of Death: 1223/05/09  Length of Stay: 2  Referring Physician: Patient, No Pcp Per   Reason(s) for Hospitalization  Status post cardiac arrest  Diagnoses  Preliminary cause of death: Hunt Hess 5 subarachnoid hemorrhage Secondary Diagnoses (including complications and co-morbidities):  Principal Problem:   Cardiac arrest [facility]) Active Problems:   Subarachnoid hemorrhage (HCC)   Acute respiratory failure with hypoxia (HCC)   Palliative care by specialist   Goals of care, counseling/discussion   Brief Hospital Course (including significant findings, care, treatment, and services provided and events leading to death)  Rachel Avila is a 54 y.o. year old female who had a witnessed seizure at home followed by cardiac arrest.  Resuscitated by EMS.  ROSC after 20 minutes.  Intubated in ED.  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  Diffuse cerebral edema was seen on initial scan.  Given catastrophic hemorrhage no intervention was initiated to secure the aneurysm.  From the time of presentation patient had absent brainstem reflexes except for spontaneous respiration. She maintained spontaneous respirations throughout her course in hospital but became increasingly hemodynamically unstable.  Repeat CT scanning revealed worsening cerebral edema.  The situation was discussed with the family with the assistance of palliative care and the patient was eventually transitioned to comfort care and compassionately extubated.  Pertinent Labs and Studies  Significant Diagnostic Studies EEG  Result Date: 10/26/19 Charlsie Quest, MD     10-26-2019  4:49 PM Patient Name: Rachel Avila MRN: [id number] Epilepsy Attending: Charlsie Quest Referring  Physician/Provider: Dr Merrilyn Puma Date: 10/26/19 Duration: 23.58 mins Patient history: 54 year old female presented with seizure and out of hospital cardiac arrest as well as aneurysmal subarachnoid hemorrhage.  EEG to evaluate for seizures. Level of alertness: Comatose AEDs during EEG study: None Technical aspects: This EEG study was done with scalp electrodes positioned according to the 10-20 International system of electrode placement. Electrical activity was acquired at a sampling rate of 500Hz  and reviewed with a high frequency filter of 70Hz  and a low frequency filter of 1Hz . EEG data were recorded continuously and digitally stored. Description: EEG showed condyle generalized background suppression.  EEG was not reactive to noxious stimulation.  Hyperventilation and photic stimulation were not performed.   EKG artifact was seen throughout the study. 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  No seizures or epileptiform discharges were seen throughout the recording. Priyanka   CT HEAD WO CONTRAST  Result Date: 10/15/2019 CLINICAL DATA:  Subarachnoid hemorrhage, follow-up. Additional history provided: Cardiac arrest with subsequent ROSC EXAM: CT HEAD WITHOUT CONTRAST TECHNIQUE: Contiguous axial images were obtained from the base of the skull through the vertex without intravenous contrast. COMPARISON:  Prior head CT 2019/10/26. FINDINGS: Brain: Extensive acute subarachnoid hemorrhage appears unchanged in volume as compared to the prior head CT performed 2019-10-26. As before, subarachnoid hemorrhage is most prominent within the right MCA cistern and basal cisterns. More conspicuous than on the prior head CT, the ventricles are diminutive with decreased visualization of the cerebral sulci. These findings are suspicious for diffuse cerebral  edema related to hypoxic/ischemic injury given the history of recent cardiac arrest. The basal cisterns are  partly effaced and there is posterior fossa mass effect. No evidence of ventricular entrapment, hydrocephalus or midline shift. Vascular: No appreciable hyperdense vessel. Skull: No calvarial fracture or focal suspicious osseous lesion. Sinuses/Orbits: Visualized orbits show no acute finding. Paranasal sinus mucosal thickening, most notably ethmoidal and sphenoidal. No significant mastoid effusion. Partially visualized support tubes. These results will be called to the ordering clinician or representative by the Radiologist Assistant, and communication documented in the PACS or [employer]. IMPRESSION: Extensive subarachnoid hemorrhage, most notably within the right MCA cistern and basal cisterns, not significantly changed in volume as compared to the prior head CT of 09/17/2019. Findings are highly suspicious for ruptured aneurysm. More conspicuous than on the prior CT, the ventricles are diminutive and there is decreased visualization of the cerebral sulci. These findings likely reflect diffuse cerebral edema secondary to hypoxic/ischemic injury given the history of recent cardiac arrest. The basal cisterns are partly effaced and there is posterior fossa mass effect. Paranasal sinus disease in the presence of life-support tubes. Electronically Signed   By: Jackey Loge DO   On: 10/15/2019 13:54   CT Head Wo Contrast  Result Date: 09/29/2019 CLINICAL DATA:  Seizure.  CPR. EXAM: CT HEAD WITHOUT CONTRAST TECHNIQUE: Contiguous axial images were obtained from the base of the skull through the vertex without intravenous contrast. COMPARISON:  None. FINDINGS: Brain: There is extensive subarachnoid hemorrhage in the suprasellar and perimesencephalic cisterns, extending into the sylvian fissures bilaterally, the inter hemispheric fissure, and into the subarachnoid sulcal spaces. There is diffuse cerebral  edema with cerebral low-attenuation, loss of gray-white junctions, effacement of sulci, ventricles, and basal cisterns. No midline shift. Vascular: Focal hematoma in the right parasellar region measuring 1.6 x 0.9 cm, likely representing an aneurysm. Skull: The calvarium appears intact. Sinuses/Orbits: Mild mucosal thickening in the paranasal sinuses. No acute air-fluid levels. Mastoid air cells are clear. Other: An oral tube is present, likely endotracheal tube. IMPRESSION: 1. 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 2. Diffuse cerebral edema. Critical Value/emergent results were discussed at the workstation prior to the time of interpretation on 09/19/2019 at 2:16 am to provider DAVID [facility] , who verbally acknowledged these results. Electronically Signed   By: Burman Nieves M.D.   On: 09/27/2019 02:23   DG Chest Port 1 View  Result Date: November 07, 2019 CLINICAL DATA:  Hypoxia EXAM: PORTABLE CHEST 1 VIEW COMPARISON:  Yesterday FINDINGS: Endotracheal tube with tip between the clavicular heads and carina. The enteric tube and side port reaches the stomach/diaphragm. Left subclavian line with tip near the left brachiocephalic vein. Cardiomegaly. Interstitial and hazy airspace opacity on both sides. Cephalized blood flow again suggested. No visible pneumothorax. IMPRESSION: Stable hardware positioning and bilateral infiltrate which is at least partially vascular/congestive. Electronically Signed   By: Marnee Spring M.D.   On: 07-Nov-2019 04:52   DG Chest Port 1 View  Result Date: 10/15/2019 CLINICAL DATA:  Cardiac arrest EXAM: PORTABLE CHEST 1 VIEW COMPARISON:  10/13/2019 FINDINGS: Endotracheal tube tip is just above the carina. Enteric tube tip is off the field of view below the left hemidiaphragm. Cardiac enlargement with perihilar vascular congestion and infiltration or edema. No pleural effusions. No pneumothorax. Left PICC line with catheter tip  projecting over the brachiocephalic vein. IMPRESSION: Endotracheal tube tip is just above the carina. Cardiac enlargement with perihilar vascular congestion and infiltration or edema. Electronically Signed   By: Burman Nieves M.D.   On: 10/15/2019 05:47   DG CHEST  PORT 1 VIEW  Result Date: Nov 08, 2019 CLINICAL DATA:  Central line placement EXAM: PORTABLE CHEST 1 VIEW COMPARISON:  November 08, 2019 at 12:48 a.m. FINDINGS: There is no central line visible. Endotracheal tube tip is just below the level of the clavicular heads. The tip is 1.5 cm above the inferior margin of the carina. Orogastric tube side port is in unchanged position near the gastroesophageal junction. Unchanged cardiomegaly and parahilar opacities likely indicating pulmonary edema. IMPRESSION: 1. No central line visible. Endotracheal tube tip 1.5 cm above the inferior margin of the carina. 2. Unchanged appearance of orogastric tube side port at the GE junction. This should be advanced by 5-7 cm. Electronically Signed   By: Deatra Robinson M.D.   On: 11-08-19 03:00   DG Chest Port 1 View  Result Date: November 08, 2019 CLINICAL DATA:  Post intubation.  Seizure.  CPR. EXAM: PORTABLE CHEST 1 VIEW COMPARISON:  01/03/2013 FINDINGS: Endotracheal tube placed with tip measuring 3.5 cm above the carina. An enteric tube is present. Tip is not definitively visualized past the EG junction. Shallow inspiration. Diffuse cardiac enlargement. Lungs are grossly clear. No pleural effusions. No pneumothorax. Visualized bones appear intact. IMPRESSION: Endotracheal tube tip measures 3.5 cm above the carina. Enteric tube tip is not definitively visualized past the EG junction. Shallow inspiration. Diffuse cardiac enlargement. Electronically Signed   By: Burman Nieves M.D.   On: 11/08/2019 01:03   ECHOCARDIOGRAM COMPLETE  Result Date: 10/15/2019    ECHOCARDIOGRAM REPORT   Patient Name:   FRANSISCA SHAWN Date of Exam: 10/15/2019 Medical Rec #:  [id number]     Height:        66.0 in Accession #:    [id number]    Weight:       405.2 lb Date of Birth:  Nov 24, 1965     BSA:          2.700 m Patient Age:    54 years      BP:           94/58 mmHg Patient Gender: F             HR:           82 bpm. Exam Location:  Inpatient Procedure: 2D Echo, Color Doppler, Cardiac Doppler and Intracardiac            Opacification Agent Indications:    Cardiac Arrest i46.9  History:        Patient has no prior history of Echocardiogram examinations.                 Risk Factors:Hypertension, Diabetes and Dyslipidemia.  Sonographer:    Irving Burton Senior RDCS Referring Phys: [id number] CHRISTOPHER R DOROTHY  Sonographer Comments: Scanned supine on artificial respirator. IMPRESSIONS  1. Left ventricular ejection fraction, by estimation, is 60 to 65%. The left ventricle has normal function. The left ventricle has no regional wall motion abnormalities. There is moderate left ventricular hypertrophy. Left ventricular diastolic parameters are consistent with Grade II diastolic dysfunction (pseudonormalization).  2. Right ventricular systolic function is normal. The right ventricular size is normal. There is mildly elevated pulmonary artery systolic pressure. The estimated right ventricular systolic pressure is 41.4 mmHg.  3. The mitral valve is normal in structure. No evidence of mitral valve regurgitation. No evidence of mitral stenosis.  4. Tricuspid valve regurgitation is moderate.  5. The aortic valve is normal in structure. Aortic valve regurgitation is not visualized. No aortic stenosis is present.  6. The inferior vena cava is  normal in size with greater than 50% respiratory variability, suggesting right atrial pressure of 3 mmHg. FINDINGS  Left Ventricle: Left ventricular ejection fraction, by estimation, is 60 to 65%. The left ventricle has normal function. The left ventricle has no regional wall motion abnormalities. Definity contrast agent was given IV to delineate the left ventricular  endocardial borders. The  left ventricular internal cavity size was normal in size. There is moderate left ventricular hypertrophy. Left ventricular diastolic parameters are consistent with Grade II diastolic dysfunction (pseudonormalization). Right Ventricle: The right ventricular size is normal. No increase in right ventricular wall thickness. Right ventricular systolic function is normal. There is mildly elevated pulmonary artery systolic pressure. The tricuspid regurgitant velocity is 2.57  m/s, and with an assumed right atrial pressure of 15 mmHg, the estimated right ventricular systolic pressure is 41.4 mmHg. Left Atrium: Left atrial size was normal in size. Right Atrium: Right atrial size was normal in size. Pericardium: There is no evidence of pericardial effusion. Mitral Valve: The mitral valve is normal in structure. No evidence of mitral valve regurgitation. No evidence of mitral valve stenosis. Tricuspid Valve: The tricuspid valve is normal in structure. Tricuspid valve regurgitation is moderate . No evidence of tricuspid stenosis. Aortic Valve: The aortic valve is normal in structure. Aortic valve regurgitation is not visualized. No aortic stenosis is present. Pulmonic Valve: The pulmonic valve was normal in structure. Pulmonic valve regurgitation is not visualized. No evidence of pulmonic stenosis. Aorta: The aortic root is normal in size and structure. Venous: The inferior vena cava is normal in size with greater than 50% respiratory variability, suggesting right atrial pressure of 3 mmHg. IAS/Shunts: No atrial level shunt detected by color flow Doppler.  LEFT VENTRICLE PLAX 2D LVIDd:         5.00 cm  Diastology LVIDs:         3.50 cm  LV e' medial:    4.68 cm/s LV PW:         1.60 cm  LV E/e' medial:  14.5 LV IVS:        1.40 cm  LV e' lateral:   5.33 cm/s LVOT diam:     2.20 cm  LV E/e' lateral: 12.7 LV SV:         50 LV SV Index:   18 LVOT Area:     3.80 cm  RIGHT VENTRICLE RV S prime:     14.40 cm/s TAPSE (M-mode): 2.8 cm  LEFT ATRIUM           Index       RIGHT ATRIUM           Index LA diam:      4.00 cm 1.48 cm/m  RA Area:     13.90 cm LA Vol (A2C): 55.5 ml 20.56 ml/m RA Volume:   30.60 ml  11.33 ml/m LA Vol (A4C): 56.5 ml 20.93 ml/m  AORTIC VALVE LVOT Vmax:   89.40 cm/s LVOT Vmean:  70.600 cm/s LVOT VTI:    0.131 m  AORTA Ao Root diam: 3.70 cm Ao Asc diam:  3.40 cm MITRAL VALVE               TRICUSPID VALVE MV Area (PHT): 3.27 cm    TR Peak grad:   26.4 mmHg MV Decel Time: 232 msec    TR Vmax:        257.00 cm/s MV E velocity: 67.70 cm/s MV A velocity: 49.70 cm/s  SHUNTS MV E/A ratio:  1.36        Systemic VTI:  0.13 m                            Systemic Diam: 2.20 cm Donato SchultzMark Skains MD Electronically signed by Donato SchultzMark Skains MD Signature Date/Time: 10/15/2019/1:22:38 PM    Final     Microbiology No results found for this or any previous visit (from the past 240 hour(s)).  Lab Basic Metabolic Panel: No results for input(s): NA, K, CL, CO2, GLUCOSE, BUN, CREATININE, CALCIUM, MG, PHOS in the last 168 hours. Liver Function Tests: No results for input(s): AST, ALT, ALKPHOS, BILITOT, PROT, ALBUMIN in the last 168 hours. No results for input(s): LIPASE, AMYLASE in the last 168 hours. No results for input(s): AMMONIA in the last 168 hours. CBC: No results for input(s): WBC, NEUTROABS, HGB, HCT, MCV, PLT in the last 168 hours. Cardiac Enzymes: No results for input(s): CKTOTAL, CKMB, CKMBINDEX, TROPONINI in the last 168 hours. Sepsis Labs: No results for input(s): PROCALCITON, WBC, LATICACIDVEN in the last 168 hours.  Procedures/Operations  Mechanical ventilation   Dixie Coppa 10/29/2019, 5:07 PM

## 2022-04-11 IMAGING — CT CT HEAD W/O CM
4 series · 16 of 47 positions shown, 18 images · non-contrast
Comparison: None.

CLINICAL DATA: Seizure.  CPR.

EXAM:
CT HEAD WITHOUT CONTRAST
TECHNIQUE: Contiguous axial images were obtained from the base of the skull
through the vertex without intravenous contrast.

[Series 3: head wo · axial · 0.45mm/px · z∈[-96,+24]mm · 7 of 33 slices shown, 9 images]
[im 5/33  brain]
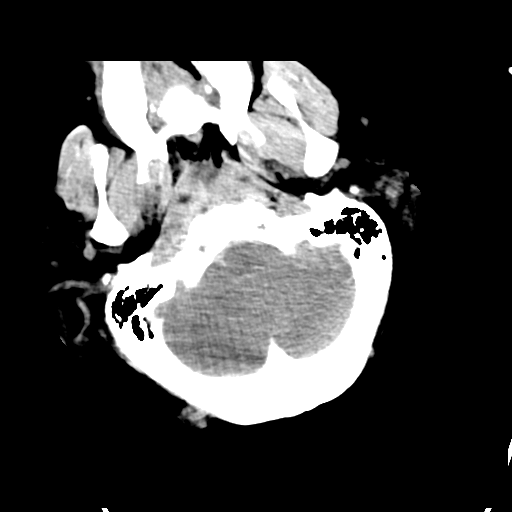
[im 5/33  bone]
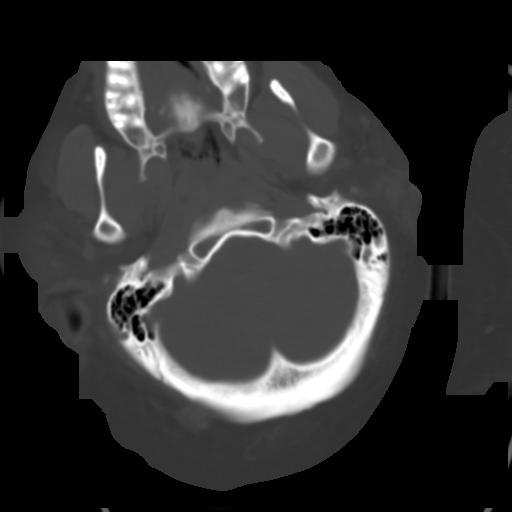
[im 9/33  brain]
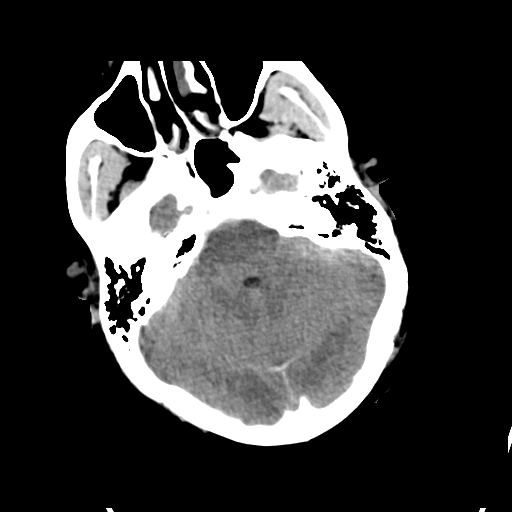
[im 13/33  brain]
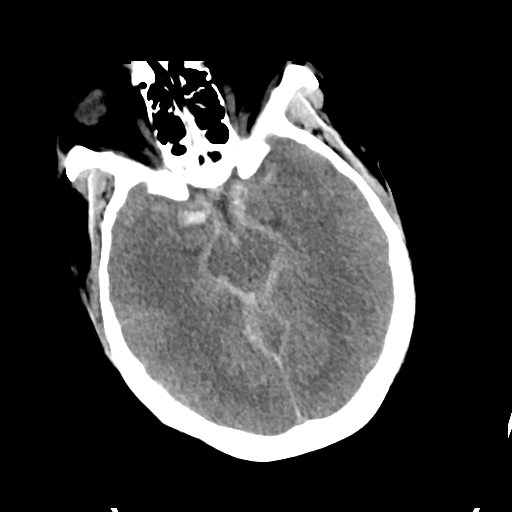
[im 17/33  brain]
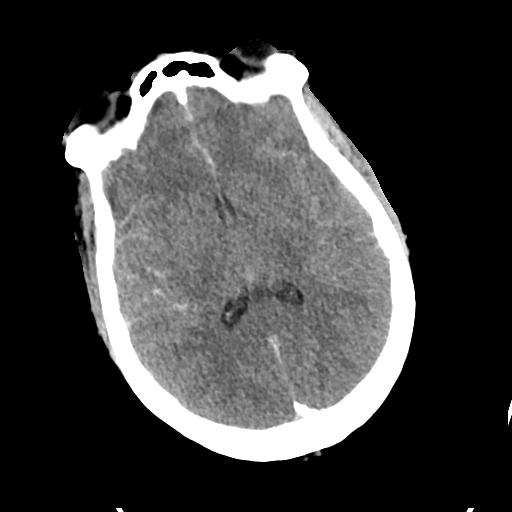
[im 21/33  brain]
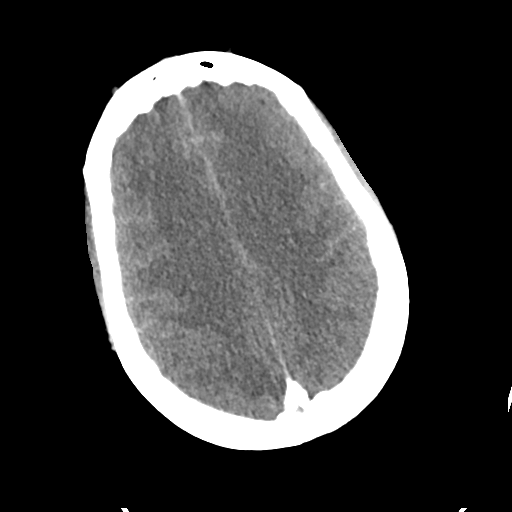
[im 21/33  bone]
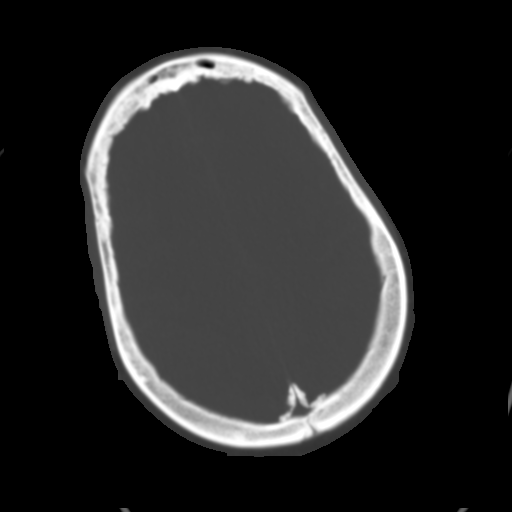
[im 25/33  brain]
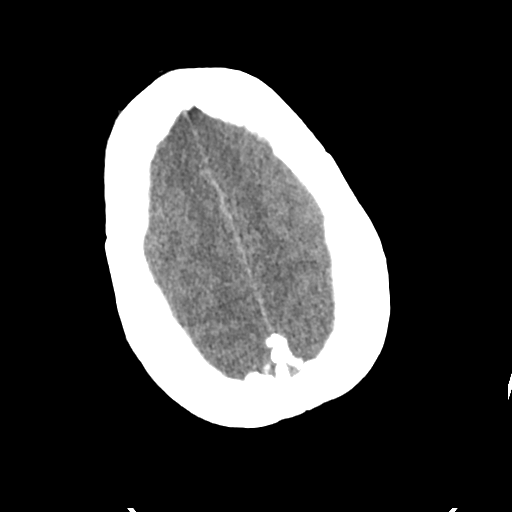
[im 29/33  brain]
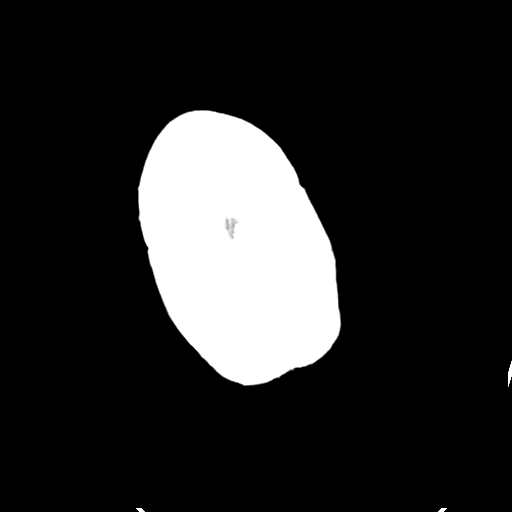

[Series 4: head bone · axial · 0.45mm/px · z∈[-100,-68]mm · 3 of 82 slices shown]
[im 9/82  bone]
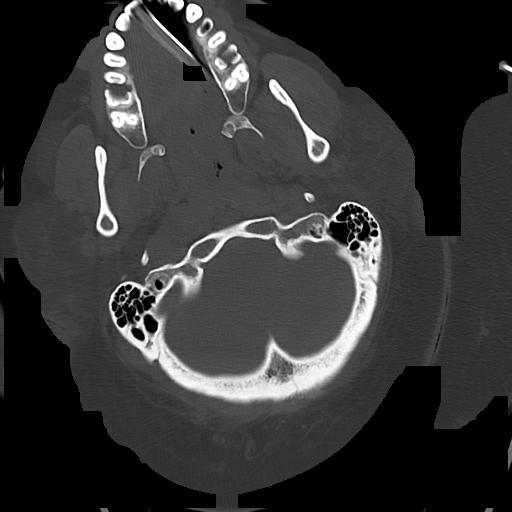
[im 17/82  bone]
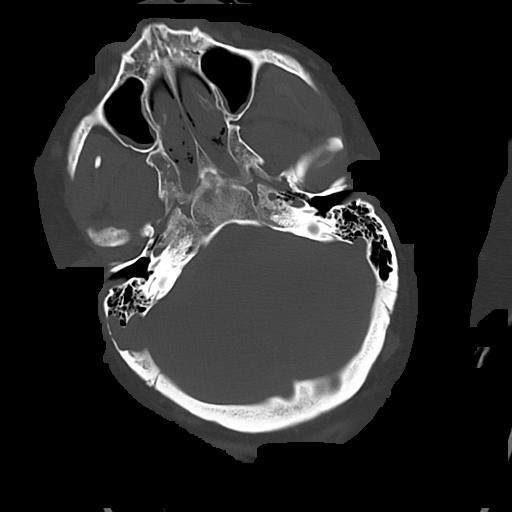
[im 25/82  bone]
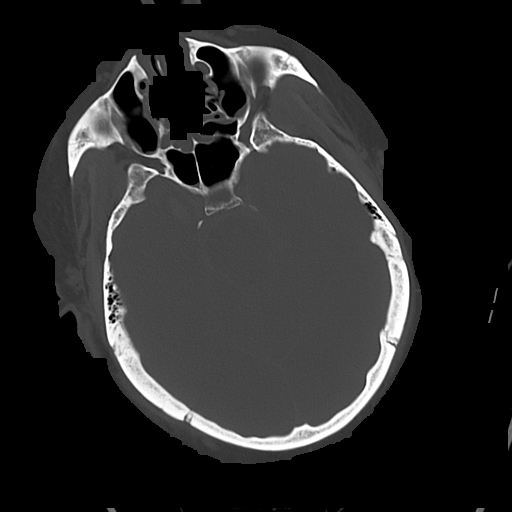

[Series 5: cor soft · coronal · 0.34mm/px · 3 of 74 slices shown]
[im 25/74  brain]
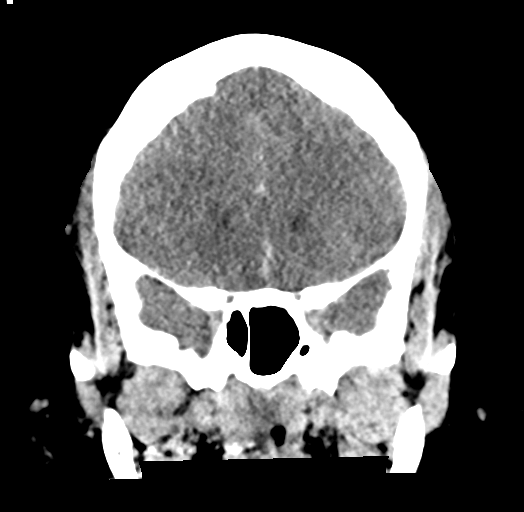
[im 33/74  brain]
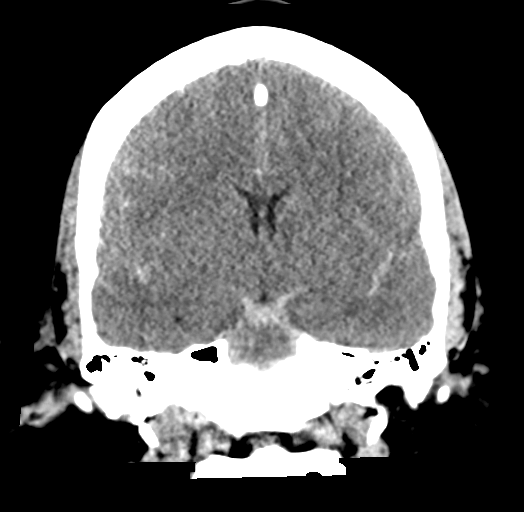
[im 41/74  brain]
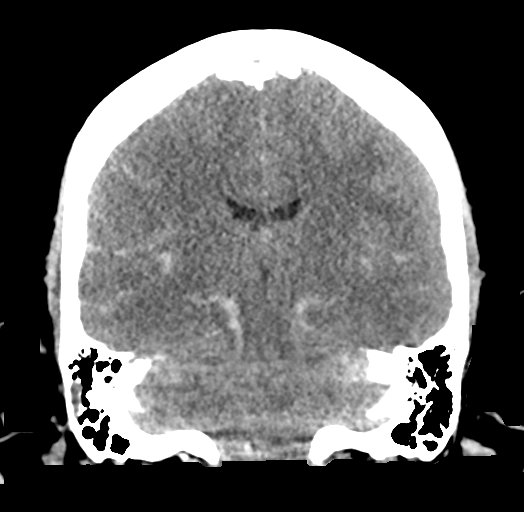

[Series 6: sag soft · sagittal · 0.33mm/px · 3 of 56 slices shown]
[im 19/56  brain]
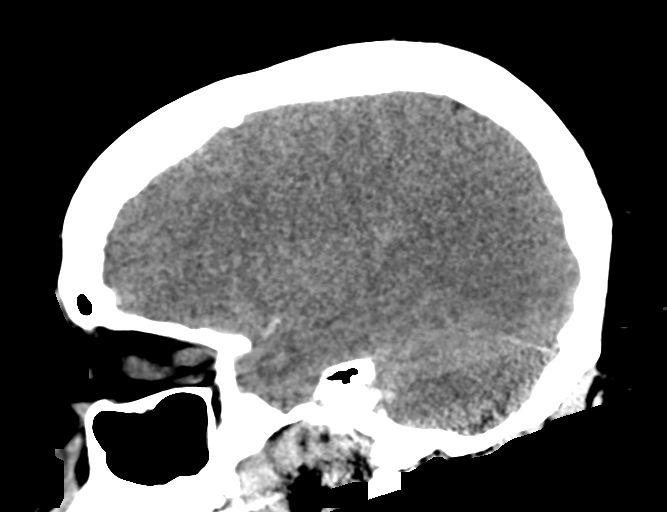
[im 28/56  brain]
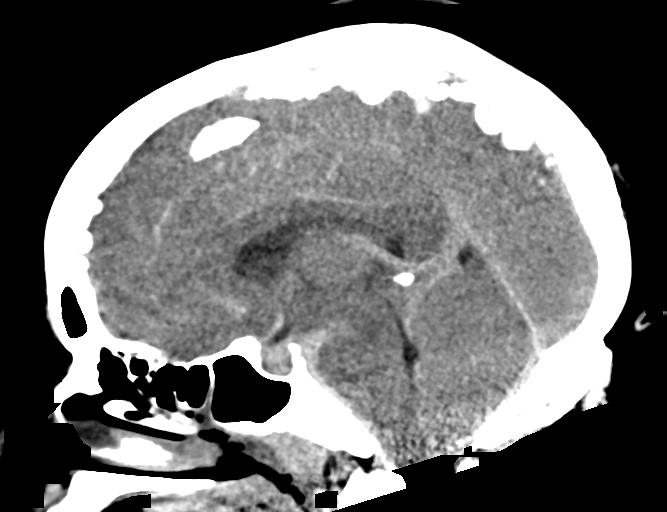
[im 37/56  brain]
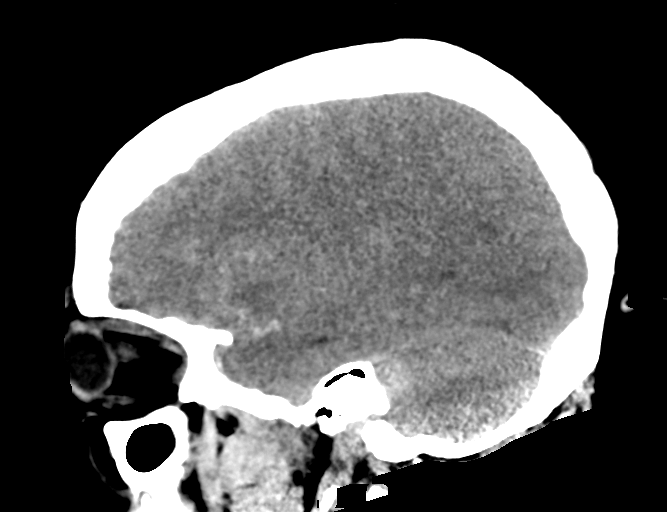

[16 of 47 positions shown; findings below may reference images not displayed]

FINDINGS: Brain: There is extensive subarachnoid hemorrhage in the suprasellar
and perimesencephalic cisterns, extending into the sylvian fissures
bilaterally, the inter hemispheric fissure, and into the
subarachnoid sulcal spaces. There is diffuse cerebral edema with
cerebral low-attenuation, loss of gray-white junctions, effacement
of sulci, ventricles, and basal cisterns. No midline shift.

Vascular: Focal hematoma in the right parasellar region measuring
1.6 x 0.9 cm, likely representing an aneurysm.

Skull: The calvarium appears intact.

Sinuses/Orbits: Mild mucosal thickening in the paranasal sinuses. No
acute air-fluid levels. Mastoid air cells are clear.

Other: An oral tube is present, likely endotracheal tube.
IMPRESSION: 1. Aneurysm in the right parasellar region with extensive
subarachnoid hemorrhage consistent with rupture.
2. Diffuse cerebral edema.

Critical Value/emergent results were discussed at the workstation
prior to the time of interpretation on 10/14/2019 at [DATE] to
provider UIIUE BLIGNAUT , who verbally acknowledged these results.

## 2022-04-13 IMAGING — DX DG CHEST 1V PORT
1 series · 1 of 1 positions shown · non-contrast
Comparison: Yesterday

CLINICAL DATA: Hypoxia

EXAM:
PORTABLE CHEST 1 VIEW

[chest]
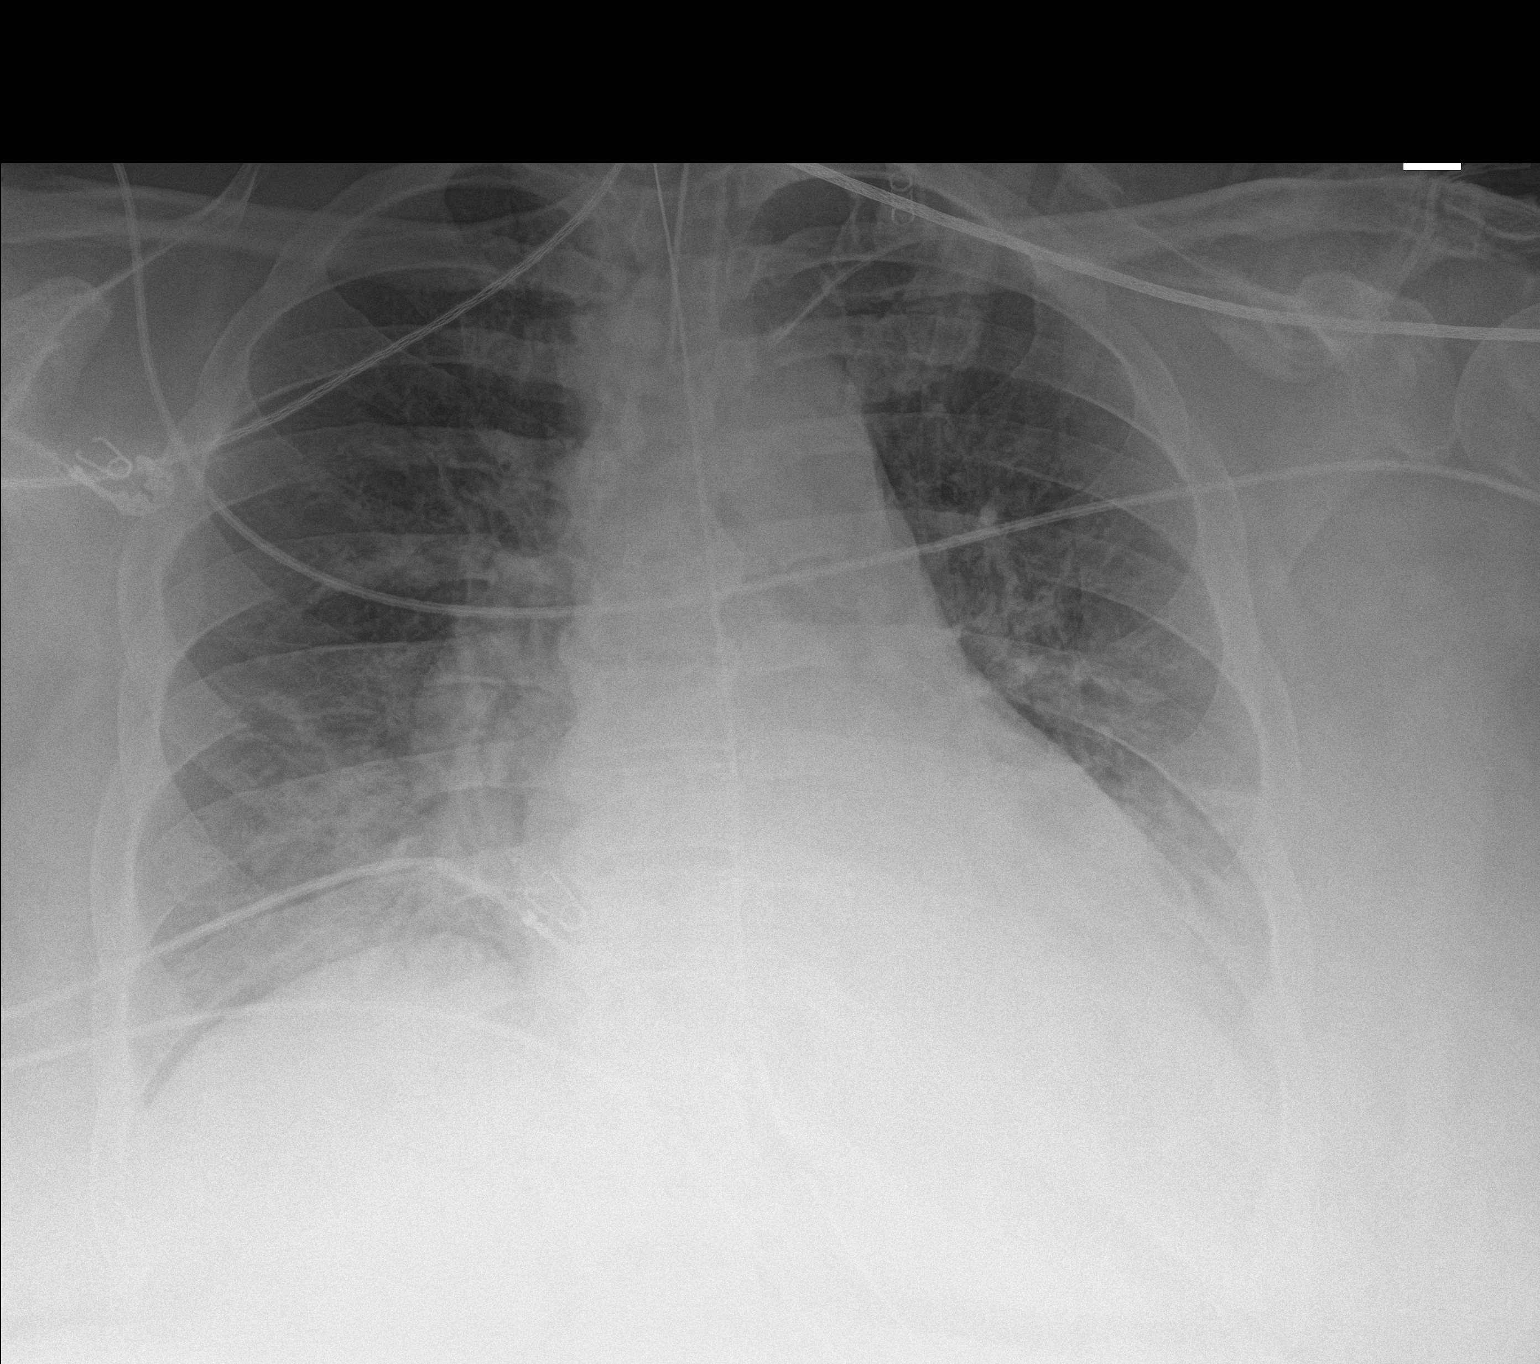

[1 of 1 positions shown; findings below may reference images not displayed]

FINDINGS: Endotracheal tube with tip between the clavicular heads and carina.
The enteric tube and side port reaches the stomach/diaphragm. Left
subclavian line with tip near the left brachiocephalic vein.
Cardiomegaly. Interstitial and hazy airspace opacity on both sides.
Cephalized blood flow again suggested. No visible pneumothorax.
IMPRESSION: Stable hardware positioning and bilateral infiltrate which is at
least partially vascular/congestive.
